# Patient Record
Sex: Female | Born: 1940
Health system: Southern US, Community
[De-identification: ages and names within clinical notes are randomized; demographics above are authoritative.]

## PROBLEM LIST (undated history)

## (undated) DIAGNOSIS — E119 Type 2 diabetes mellitus without complications: Secondary | ICD-10-CM

## (undated) DIAGNOSIS — E785 Hyperlipidemia, unspecified: Secondary | ICD-10-CM

## (undated) DIAGNOSIS — I10 Essential (primary) hypertension: Secondary | ICD-10-CM

## (undated) DIAGNOSIS — J45909 Unspecified asthma, uncomplicated: Secondary | ICD-10-CM

## (undated) HISTORY — DX: Type 2 diabetes mellitus without complications: E11.9

## (undated) HISTORY — PX: OVARIAN CYST REMOVAL: SHX89

## (undated) HISTORY — PX: ABDOMINAL HYSTERECTOMY: SHX81

## (undated) HISTORY — DX: Hyperlipidemia, unspecified: E78.5

## (undated) HISTORY — DX: Essential (primary) hypertension: I10

## (undated) HISTORY — DX: Unspecified asthma, uncomplicated: J45.909

---

## 2005-08-01 ENCOUNTER — Ambulatory Visit: Payer: Self-pay | Admitting: *Deleted

## 2005-08-07 ENCOUNTER — Ambulatory Visit: Payer: Self-pay | Admitting: *Deleted

## 2005-08-07 ENCOUNTER — Encounter: Payer: Self-pay | Admitting: Cardiology

## 2005-08-07 ENCOUNTER — Encounter (HOSPITAL_COMMUNITY): Admission: RE | Admit: 2005-08-07 | Discharge: 2005-09-06 | Payer: Self-pay | Admitting: *Deleted

## 2005-08-21 ENCOUNTER — Ambulatory Visit: Payer: Self-pay | Admitting: *Deleted

## 2005-10-20 ENCOUNTER — Inpatient Hospital Stay (HOSPITAL_COMMUNITY): Admission: EM | Admit: 2005-10-20 | Discharge: 2005-10-22 | Payer: Self-pay | Admitting: Emergency Medicine

## 2005-10-21 ENCOUNTER — Ambulatory Visit: Payer: Self-pay | Admitting: Cardiology

## 2007-03-11 IMAGING — CR DG CHEST 2V
2 series · 2 of 2 positions shown · non-contrast
Comparison: none

CLINICAL DATA: Chest pain for three weeks.  Some shortness of breath.  
 NOOHV-E VIEWS:
 PA and lateral views of the chest are made without previous films for comparison show the heart, lungs, bony thorax and soft tissues to be within the normal limits.  There is minimal anterior degenerative hypertrophic spurring of the lower thoracic spine.

[view not recorded (1 of 2)]
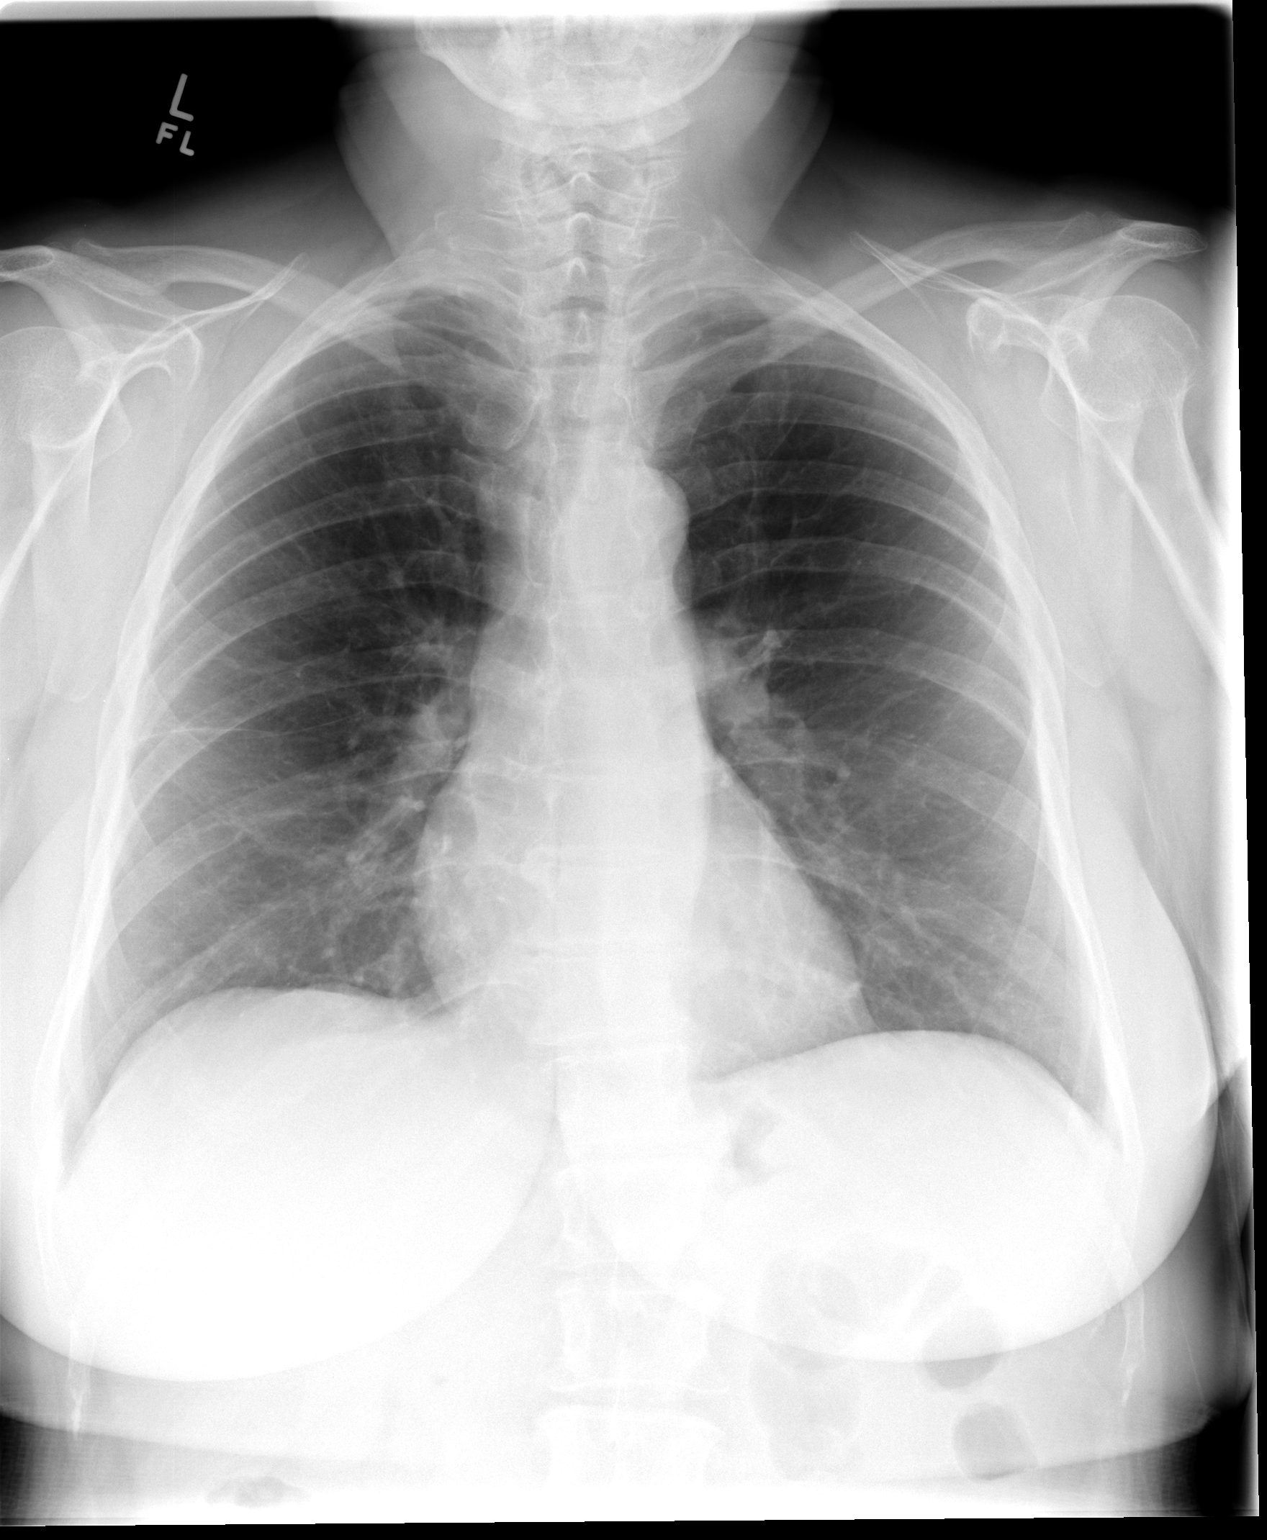

[view not recorded (2 of 2)]
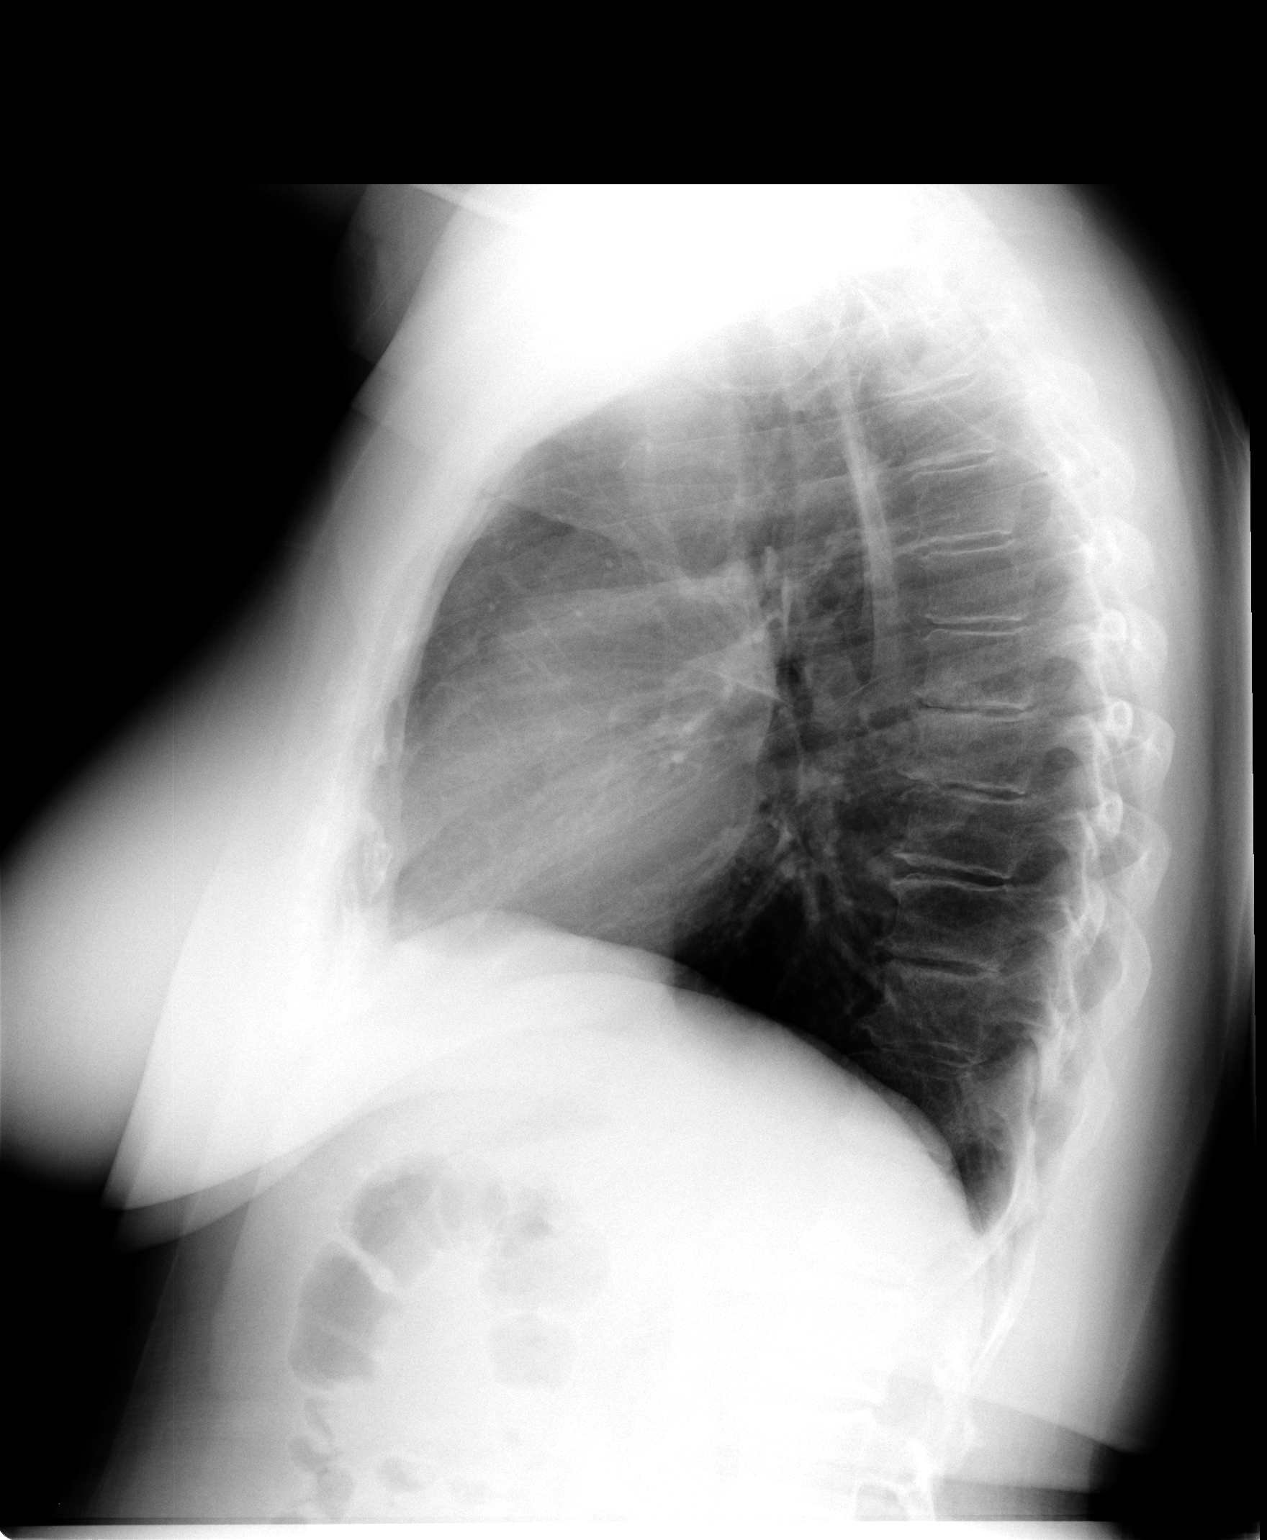

[2 of 2 positions shown; findings below may reference images not displayed]

IMPRESSION: No evidence of active disease in the chest.

## 2007-10-22 ENCOUNTER — Encounter (INDEPENDENT_AMBULATORY_CARE_PROVIDER_SITE_OTHER): Payer: Self-pay | Admitting: Internal Medicine

## 2007-10-22 ENCOUNTER — Ambulatory Visit: Payer: Self-pay | Admitting: Cardiology

## 2007-10-22 LAB — CONVERTED CEMR LAB
AST: 15 units/L
Albumin: 4 g/dL
BUN: 13 mg/dL
Basophils Absolute: 0.1 10*3/uL
CO2: 20 meq/L
Calcium: 9.1 mg/dL
Creatinine, Ser: 0.98 mg/dL
Glucose, Bld: 167 mg/dL
HCT: 38.2 %
Hemoglobin: 12.4 g/dL
Hgb A1c MFr Bld: 6.8 %
LDL Cholesterol: 97 mg/dL
Lymphs Abs: 2.6 10*3/uL
MCHC: 32.5 g/dL
Monocytes Relative: 8 %
RDW: 12.6 %
Total Bilirubin: 0.3 mg/dL
Total CHOL/HDL Ratio: 5.1
Total Protein: 7.3 g/dL
Triglycerides: 166 mg/dL
VLDL: 33 mg/dL

## 2007-10-27 ENCOUNTER — Encounter (INDEPENDENT_AMBULATORY_CARE_PROVIDER_SITE_OTHER): Payer: Self-pay | Admitting: Internal Medicine

## 2007-10-28 ENCOUNTER — Ambulatory Visit: Payer: Self-pay | Admitting: Internal Medicine

## 2007-10-28 DIAGNOSIS — I10 Essential (primary) hypertension: Secondary | ICD-10-CM | POA: Insufficient documentation

## 2007-10-28 DIAGNOSIS — M810 Age-related osteoporosis without current pathological fracture: Secondary | ICD-10-CM | POA: Insufficient documentation

## 2007-11-03 ENCOUNTER — Ambulatory Visit (HOSPITAL_COMMUNITY): Admission: RE | Admit: 2007-11-03 | Discharge: 2007-11-03 | Payer: Self-pay | Admitting: Internal Medicine

## 2007-11-03 ENCOUNTER — Encounter (INDEPENDENT_AMBULATORY_CARE_PROVIDER_SITE_OTHER): Payer: Self-pay | Admitting: Internal Medicine

## 2007-11-04 HISTORY — PX: COLONOSCOPY W/ POLYPECTOMY: SHX1380

## 2007-11-05 ENCOUNTER — Encounter (INDEPENDENT_AMBULATORY_CARE_PROVIDER_SITE_OTHER): Payer: Self-pay | Admitting: Internal Medicine

## 2007-11-19 ENCOUNTER — Encounter (INDEPENDENT_AMBULATORY_CARE_PROVIDER_SITE_OTHER): Payer: Self-pay | Admitting: Internal Medicine

## 2007-11-24 ENCOUNTER — Ambulatory Visit: Payer: Self-pay | Admitting: Gastroenterology

## 2007-11-24 ENCOUNTER — Ambulatory Visit (HOSPITAL_COMMUNITY): Admission: RE | Admit: 2007-11-24 | Discharge: 2007-11-24 | Payer: Self-pay | Admitting: Gastroenterology

## 2007-11-24 ENCOUNTER — Encounter (INDEPENDENT_AMBULATORY_CARE_PROVIDER_SITE_OTHER): Payer: Self-pay | Admitting: Internal Medicine

## 2007-11-24 ENCOUNTER — Encounter: Payer: Self-pay | Admitting: Gastroenterology

## 2007-12-22 ENCOUNTER — Ambulatory Visit: Payer: Self-pay | Admitting: Internal Medicine

## 2007-12-22 DIAGNOSIS — D72829 Elevated white blood cell count, unspecified: Secondary | ICD-10-CM | POA: Insufficient documentation

## 2007-12-22 DIAGNOSIS — R7309 Other abnormal glucose: Secondary | ICD-10-CM | POA: Insufficient documentation

## 2007-12-23 LAB — CONVERTED CEMR LAB
BUN: 15 mg/dL (ref 6–23)
Basophils Relative: 1 % (ref 0–1)
Calcium: 9.5 mg/dL (ref 8.4–10.5)
Eosinophils Absolute: 0.2 10*3/uL (ref 0.0–0.7)
Eosinophils Relative: 2 % (ref 0–5)
Glucose, Bld: 118 mg/dL — ABNORMAL HIGH (ref 70–99)
HCT: 37.4 % (ref 36.0–46.0)
Hemoglobin: 12.5 g/dL (ref 12.0–15.0)
Lymphocytes Relative: 25 % (ref 12–46)
Lymphs Abs: 2.1 10*3/uL (ref 0.7–4.0)
MCHC: 33.4 g/dL (ref 30.0–36.0)
Monocytes Relative: 7 % (ref 3–12)
Neutrophils Relative %: 65 % (ref 43–77)

## 2008-03-23 ENCOUNTER — Ambulatory Visit: Payer: Self-pay | Admitting: Internal Medicine

## 2008-03-23 LAB — CONVERTED CEMR LAB: Blood Glucose, Fingerstick: 112

## 2008-10-11 ENCOUNTER — Telehealth (INDEPENDENT_AMBULATORY_CARE_PROVIDER_SITE_OTHER): Payer: Self-pay | Admitting: *Deleted

## 2008-10-12 ENCOUNTER — Ambulatory Visit: Payer: Self-pay | Admitting: Internal Medicine

## 2008-10-12 DIAGNOSIS — R12 Heartburn: Secondary | ICD-10-CM | POA: Insufficient documentation

## 2008-10-12 DIAGNOSIS — M542 Cervicalgia: Secondary | ICD-10-CM | POA: Insufficient documentation

## 2008-10-20 DIAGNOSIS — E785 Hyperlipidemia, unspecified: Secondary | ICD-10-CM | POA: Insufficient documentation

## 2010-05-28 ENCOUNTER — Encounter: Payer: Self-pay | Admitting: Internal Medicine

## 2010-09-18 NOTE — Assessment & Plan Note (Signed)
[facility]                       [facility] OFFICE NOTE   NAME:Knightly, [name]                          MRN:          [id number]  DATE:10/22/2007                            DOB:          Aug 11, 1940    REFERRING:  None.   [name] returns to the office at the request of her daughter.  She was  previously followed by Dr. Dorethea Clan for hypertension and dyslipidemia.  She had had a history of chest discomfort and an abnormal stress EKG,  but nuclear imaging of her myocardium was normal.  She has continued to  do well, but discontinued all of her medicines when the prescription was  exhausted.  She now returns for reevaluation and renewal of her  prescriptions as appropriate.   [name] was hospitalized approximately 2 years ago for a polyarthritis  and diffuse skin rash.  No specific diagnosis was established.  All of  her symptoms resolved.  She was recently seen in an Urgent Care in  [place] for arthritic discomfort, felt due to gout.  She has taken a  course of Naprosyn with improvement.  Her only other routine medication  is a calcium supplement.  She was previously treated with Toprol,  aspirin, simvastatin, and lisinopril.   Review of systems, past medical, social, and family history were updated  with appropriate notations made to the patient's office chart.   PHYSICAL EXAMINATION:  GENERAL:  Pleasant, diminutive woman, in no acute  distress.  VITAL SIGNS:  The weight is 125, 12 pounds less than 2 year ago.  Blood  pressure 145/80 on the right and 150/80 on the left.  Heart rate 75 and  regular and respirations 14.  HEENT:  Nonicteric sclerae; EOMs full; normal lids and conjunctivae.  NECK:  No jugular venous distention; no carotid bruits.  ENDOCRINE:  No thyromegaly.  HEMATOPOIETIC:  No adenopathy.  LUNGS:  Clear.  CARDIAC:  Normal first and second heart sounds; modest sytolic ejection  murmur; fourth heart sound present.  ABDOMEN:   Soft and nontender; no organomegaly.  EXTREMITIES:  No edema; 1+ distal pulses; pulses are monophasic and  biphasic by Doppler.   Health maintenance issues were reviewed.  The patient has never had a  mammogram.  She has never undergone colonoscopy.  She has not had a bone  densitometry study.  She did receive pneumococcal vaccine approximately  2 years ago, but does not receive influenza vaccine routinely.   IMPRESSION:  Overall, [name] is doing quite well.  She does appear to  have mild hypertension.  This may be somewhat exacerbated by treatment  with a nonsteroidal antiinflammatory agent, but she would benefit from  some therapy.  Lisinopril will be started at a dose of 20 mg daily with  subsequent blood pressure checks and a chemistry profile in one month.   She will require substantial health maintenance attention.  We will  refer her to primary care doctor for this.   Baseline laboratories including a chemistry profile, CBC, stool for  Hemoccult testing, and a lipid profile will be obtained.  Dyslipidemia  will  be treated, if necessary, according to guidelines.  She also has a  questionable history of diabetes in the past.  An A1c level will be  obtained.  I will not plan to see this nice lady routinely.     Gerrit Friends. Dietrich Pates, MD, [facility]  Electronically Signed    RMR/MedQ  DD: 10/22/2007  DT: 10/23/2007  Job #: [id number]

## 2010-09-18 NOTE — Op Note (Signed)
[name], [name]                   ACCOUNT NO.:  [id number]   MEDICAL RECORD NO.:  [id number]          PATIENT TYPE:  AMB   LOCATION:  DAY                           FACILITY:  [facility]   PHYSICIAN:  Kassie Mends, M.D.      DATE OF BIRTH:  [birth year]   DATE OF PROCEDURE:  11/24/2007  DATE OF DISCHARGE:                               OPERATIVE REPORT   REFERRING PHYSICIAN:  Erle Crocker, MD   PROCEDURE:  Colonoscopy with cold forceps and snare cautery polypectomy.   INDICATIONS FOR PROCEDURE:  [name] is a 70 year old female who  presents with a family history of colon cancer.   FINDINGS:  1. Multiple ascending colon polyps.  The polyps were between 3 mm and      6 mm.  The polyps were removed via cold forceps and snare cautery.      Single transverse colon polyp approximately 3 mm in size was      removed via cold forceps.  Single descending colon polyp      approximately 6 mm in size were removed via cold forceps.  2. Frequent sigmoid colon diverticula.  Otherwise, no masses,      inflammatory changes, or AVMs seen.  3. Normal retroflexed view of the rectum.   DIAGNOSIS:  Multiple colon polyps.   RECOMMENDATIONS:  1. Screening colonoscopy in 5 years due to family history of colon      cancer and multiple polyps found on this endoscopy .  2. No aspirin, NSAIDs, or anticoagulation for 7 days.  3. Will call [name] with the results of her biopsies.  4. She should follow a high-fiber diet.  She was given a handout on      high-fiber diet, polyps, and diverticulosis.   MEDICATIONS:  1. Demerol 50 mg IV.  2. Versed 5 mg IV.   PROCEDURE TECHNIQUE:  Physical exam was performed, informed consent was  obtained from the patient after explaining the benefits, risks, and  alternatives to the procedure.  The patient was connected to the monitor  and placed in left lateral position.  Continuous oxygen was provided via  nasal cannula and IV medicines were administered through an  indwelling  cannula.  After administration, sedation, and rectal exam, the patient's  rectum was intubated and the scope was advanced under direct  visualization through the cecum.  The scope was removed  slowly by carefully examining the color, texture, anatomy, and integrity  of the mucosa on the way out.  The patient was recovered in endoscopy  and discharged home in satisfactory condition.   PATH:  Simple adenomas. TCS 5 years. High fiber diet.      Kassie Mends, M.D.  Electronically Signed     SM/MEDQ  D:  11/24/2007  T:  11/25/2007  Job:  [id number]   cc:   Erle Crocker, M.D.

## 2010-09-21 NOTE — Discharge Summary (Signed)
[name], [name]                   ACCOUNT NO.:  [id number]   MEDICAL RECORD NO.:  [id number]          PATIENT TYPE:  INP   LOCATION:  A313                          FACILITY:  [facility]   PHYSICIAN:  Osvaldo Shipper, MD     DATE OF BIRTH:  03-Jun-1940   DATE OF ADMISSION:  10/20/2005  DATE OF DISCHARGE:  06/19/2007LH                                 DISCHARGE SUMMARY   PRIMARY CARE PHYSICIAN:  The patient does not have a PMD.  She is going to  get an appointment with Dr. Katrinka Blazing for follow-up.  She sees a cardiologist,  Dr. Vida Roller.   DISCHARGE DIAGNOSES:  1.  Polyarthritis of unclear etiology.  2.  Likely type 2 diabetes requiring outpatient follow-up.  3.  Hypertension and dyslipidemia, stable.  4.  Abnormal stress test.  Follow up with cardiology.  5.  Acute bronchitis, improved.   HISTORY OF PRESENT ILLNESS:  Please review H & P dictated at the time of  admission for details regarding patient's presenting illness.   BRIEF HOSPITAL COURSE:  1.  Polyarthritis.  The patient is a 70 year old Caucasian female who has a      past medical history of dyslipidemia, hypertension, and an abnormal      stress test for which she is following with cardiology, who presented to      the ED with fevers, chills, and joint pains.  Most of her joint      involvement was basically her bilaterally wrists as well as bilateral      ankles.  She had sparing of her elbow and knee joints.  The etiology for      her acute polyarthritis was not very clear.  The patient was noted to be      recovering from an acute upper respiratory tract infection and it was      thought that this was likely secondary to a viral syndrome.  This could      also be secondary to gout, although her uric acid was 7.4 which is just      borderline high.  Her ANA and rheumatoid factor were also unremarkable.      X-rays of the wrist joint showed soft tissue swelling with no evidence      for inflammatory arthropathy.  Blood  cultures were negative.  An      echocardiogram was also done which was unremarkable.  There was also a      question of a tick bite for which doxycycline was initiated.  The      patient was also started on steroids which will be continued as well for      now.  I think the patient may have seronegative arthritis.  We will try      to make an appointment for the patient with a rheumatologist.  We do not      have one in this community.  The closest would be at [facility].  2.  Elevated blood sugars.  These were noted at the time of admission.  HBA1C  was checked which was 6.5.  The patient likely has type 2      diabetes.  I have asked her to follow up with a PMD to further monitor      and initiate treatment for this.  3.  Her other medical issues include hypertension, dyslipidemia, and an      abnormal stress test about 2-3 months ago.  She may follow up with her      cardiologist for further management of the above.  She remained chest      pain free during this admission.   On the day of discharge, she had been afebrile for more than 24 hours.  She  is saturating well on room air.  She feels quite well and has less  discomfort in her joints.  She has been ambulating with no difficulties.  Based on the above, she is considered stable for discharge.   DISCHARGE MEDICATIONS:  1.  Ibuprofen 400 mg three times a day with meals for three days and then as      needed.  2.  Doxycycline 100 mg twice a day for nine days.  3.  Prednisone 40 mg for three days, then 20 mg for four days, then 10 mg      for four days, then 5 mg for four days, and then stop.  4.  She has been asked to continue her other home medications as before      which include Toprol XL, Simvastatin, lisinopril, and aspirin.   FOLLOW UP:  1.  Follow up with Dr. Stacey Drain, appointment will be made in 1-2      weeks if possible.  2.  With PMD, patient is choosing Dr. Katrinka Blazing.  She was encouraged to make an       earlier appointment especially considering the possibility of type 2      diabetes.   OTHER INSTRUCTIONS:  The patient has been told that steroids may increase  her blood sugars.  She has been asked to make a quick appointment with Dr.  Katrinka Blazing.  She has been asked to check her blood sugars at the health  department if she experiences any dizziness, any vision difficulties,  nausea, or vomiting.  She has been asked to return to the ED if she has high  fevers not relieved with Tylenol or if she has any other symptoms which are  not easily relieved.   CONSULTATIONS:  No consultations obtained during this hospital stay.   DIET:  She should have a heart-healthy diet.   PHYSICAL ACTIVITY:  No restrictions.   IMAGING AND OTHER STUDIES:  X-ray of the chest which did not show any acute  or active disease process.  She also had x-ray of her wrist joint which just  showed soft tissue swelling without evidence for inflammatory arthropathy.  There were some osteoarthritic changes of the first carpal joint of the  right hand.  She also had an echocardiogram which was also unremarkable.      Osvaldo Shipper, MD  Electronically Signed     GK/MEDQ  D:  10/22/2005  T:  10/22/2005  Job:  [id number]   cc:   Dirk Dress. Katrinka Blazing, M.D.  Fax: [phone]   Aundra Dubin, M.D.  [address]  [place]  [place] [address]   Vida Roller, M.D.  Fax: [phone]

## 2010-09-21 NOTE — H&P (Signed)
NAMECHRISTENA, [name]                   ACCOUNT NO.:  [id number]   MEDICAL RECORD NO.:  [id number]          PATIENT TYPE:  INP   LOCATION:  A313                          FACILITY:  [facility]   PHYSICIAN:  Margaretmary Dys, M.D.DATE OF BIRTH:  1940-09-30   DATE OF ADMISSION:  10/20/2005  DATE OF DISCHARGE:  LH                                HISTORY & PHYSICAL   ADMITTING DIAGNOSES:  1.  Polyarthritis of acute onset.  2.  Generalized myalgia.   PRIMARY CARE PHYSICIAN:  The patient is unassigned.   CHIEF COMPLAINT:  Generalized pain and multiple joint aches.   HISTORY OF PRESENT ILLNESS:  [name] is a 70 year old Caucasian female who  denies any significant past medical history.  She presents to the emergency  room with a 2-day history of acute joint pain involving her wrist and also  her ankles.  The patient has been unable to ambulate because of the pain.  She describes the pain as a 7-8/10.  There is no associated swelling or  warmth of those joints.  She denies any fevers, but daughters felt that she  may been warm to touch.  She denies any nausea or vomiting, no abdominal  pain.   She was seen in the emergency room and felt concern for probable underlying  cause for her acute polyarthritis.  The patient has been admitted now for  further management.  She denies any prior history of degenerative joint  disease.   REVIEW OF SYSTEMS:  Ten-point review of systems otherwise negative, except  as mentioned in history of present illness.   PAST MEDICAL HISTORY:  1.  Hypertension.  2.  Dyslipidemia.   PAST SURGICAL HISTORY:  Hysterectomy.   MEDICATIONS:  1.  Toprol-XL 30 mg p.o. once a day, which appears to be a strange dose.  2.  Simvastatin 40 mg at bedtime.  3.  Lisinopril 5 mg p.o. once a day.  4.  Aspirin half a tablet once a day.   ALLERGIES:  She has no known drug allergies.   SOCIAL HISTORY:  The patient is single, lives at home by herself, does not  smoke, does not drink  alcohol, denies any illicit drug use, denies any  recent hunting trip.  [place]  She denies any  recent tick bite.   FAMILY HISTORY:  Positive for hypertension and diabetes, but otherwise  noncontributory.   PHYSICAL EXAMINATION:  GENERAL:  Conscious, alert, comfortable, not in acute  distress.  VITAL SIGNS:  Blood pressure 112/63, pulse of 83, respirations 28,  temperature 99.1 degrees Fahrenheit.  HEENT:  Normocephalic, atraumatic.  Oral mucosa was dry.  No exudates were  noted.  NECK:  Supple with no JVD and no lymphadenopathy.  LUNGS:  Clear clinically with good air entry bilaterally.  HEART:  S1 and S2 regular.  No S3, S4, gallops or rubs.  ABDOMEN:  Soft, not tender.  Bowel sounds were positive.  No masses  palpable.  EXTREMITIES:  The patient had multiple swelling and tenderness in both wrist  joints and ankle  joints.  Her elbows and her knees were spared.  I did not  feel any fluctuance.  CNS:  Exam was grossly intact with no focal deficits.   LABORATORY/DIAGNOSTIC DATA:  White blood cell count was 16.6, hemoglobin of  12.7, hematocrit 37.3 and platelet count was 449,000.  ESR was 25.  Sodium  was 130, potassium 4.0, chloride of 101, CO2 was 22, glucose of 183, BUN of  14, creatinine was 1.2, AST 22, ALT of 18, calcium was 8.9.  TSH was 1.498.  Cortisol level was 34.6.  Blood cultures are pending.   ASSESSMENT AND PLAN:  [age]-year-old Caucasian female presents with  acute polyarthritis.  Etiology is unknown at this time.  It could represent  tick bite.  The patient does have a slight leukocytosis again which may be  nonspecific.  It could be an acute viral illness; the patient says she has  just recuperated from an upper respiratory tract infection in the past  couple of weeks.  Also a possibility is acute gout.   Plan is to admit her at this time for pain control.  We will hydrate her  with intravenous fluids.  I will check a uric acid  level and rheumatoid  factor to rule out other autoimmune disease.  I did notice that her ESR is  not remarkably high though.  We will also send for [facility] spotted  fever titers.  I do not see any indication for any antibiotics at this time.  I will follow her here in the hospital.  If she spikes a fever and  leukocytosis persists, then she may benefit from starting empirically on  doxycycline for treatment of rickettsial disease.   She will be resumed on all of her home medications at this time.  We will  mobilize her as soon as she is able to.   I think further data are needed to characterize the underlying reason for  her polyarthritis.      Margaretmary Dys, M.D.  Electronically Signed     AM/MEDQ  D:  10/21/2005  T:  10/21/2005  Job:  [id number]

## 2010-09-21 NOTE — Procedures (Signed)
[name], [name]                   ACCOUNT NO.:  [id number]   MEDICAL RECORD NO.:  [id number]          PATIENT TYPE:  INP   LOCATION:  A313                          FACILITY:  [facility]   PHYSICIAN:  Paris Bing, M.D. [facility] OF BIRTH:  [birth year]   DATE OF PROCEDURE:  10/21/2005  DATE OF DISCHARGE:                                  ECHOCARDIOGRAM   REFERRING:  [place]ai[place]ese.   CLINICAL DATA:  A 70 year old woman with a polyarthritis, fever and  hypertension.   Aorta 2.7, left atrium 3.5, septum 1.1, posterior wall 1.0, LV diastole 4.2,  LV systole 2.7.   IMPRESSION:  1.  Technically adequate echocardiographic study.  2.  Normal left atrium, right atrium and right ventricle.  3.  Normal aortic, mitral, tricuspid and pulmonic valves.  4.  Normal Doppler study with physiologic tricuspid regurgitation.  5.  Normal internal dimension, wall thickness, regional and global function      of the left ventricle.  6.  Normal IVC.      Culebra Bing, M.D. [facility]  Electronically Signed     RR/MEDQ  D:  10/21/2005  T:  10/22/2005  Job:  [id number]

## 2010-09-21 NOTE — Procedures (Signed)
NAMERAINBOW, SALMAN                   ACCOUNT NO.:  [id number]   MEDICAL RECORD NO.:  [id number]           PATIENT TYPE:   LOCATION:                                 FACILITY:   PHYSICIAN:  Vida Roller, M.D.   DATE OF BIRTH:  1940/08/13   DATE OF PROCEDURE:  DATE OF DISCHARGE:                                    STRESS TEST   HISTORY:  Ms. Trapani is a 70 year old female with no known coronary disease  with atypical chest discomfort.  Her cardiac risk factors include  hypertension and hyperlipidemia.   BASELINE DATA:  Sinus rhythm at __________ beats per minute with nonspecific  ST abnormalities.  Blood pressure is 136/72.   Patient exercised for a total of 4 minutes and 34 seconds Bruce protocol  stage II and 7 METS.  Maximum heart rate achieved was 144 beats per minute  which is 93% of predicted max.  Maximum blood pressure is 148/62 and  resolved down to 122/58 in recovery.  EKG revealed no significant  arrhythmia.  She did have baseline abnormal electrocardiogram with increase  in the ST depression in inferolateral leads with exercise.   Final images and results are pending M.D. review.      Jae Dire, P.A. [facility]      Vida Roller, M.D.  Electronically Signed    AB/MEDQ  D:  08/07/2005  T:  08/08/2005  Job:  [id number]

## 2012-06-05 NOTE — Patient Instructions (Signed)
[name]  06/05/2012   Your procedure is scheduled on:  06/11/12  Report to [name] at 1240 PM.  Call this number if you have problems the morning of surgery: [phone]   Remember:   Do not eat food or drink liquids after midnight.   Take these medicines the morning of surgery with A SIP OF WATER:    Do not wear jewelry, make-up or nail polish.  Do not wear lotions, powders, or perfumes. You may wear deodorant.  Do not shave 48 hours prior to surgery. Men may shave face and neck.  Do not bring valuables to the hospital.  Contacts, dentures or bridgework may not be worn into surgery.  Leave suitcase in the car. After surgery it may be brought to your room.  For patients admitted to the hospital, checkout time is 11:00 AM the day of  discharge.   Patients discharged the day of surgery will not be allowed to drive  home.  Name and phone number of your driver: family  Special Instructions: N/A   Please read over the following fact sheets that you were given: Anesthesia Post-op Instructions and Care and Recovery After Surgery   PATIENT INSTRUCTIONS POST-ANESTHESIA  IMMEDIATELY FOLLOWING SURGERY:  Do not drive or operate machinery for the first twenty four hours after surgery.  Do not make any important decisions for twenty four hours after surgery or while taking narcotic pain medications or sedatives.  If you develop intractable nausea and vomiting or a severe headache please notify your doctor immediately.  FOLLOW-UP:  Please make an appointment with your surgeon as instructed. You do not need to follow up with anesthesia unless specifically instructed to do so.  WOUND CARE INSTRUCTIONS (if applicable):  Keep a dry clean dressing on the anesthesia/puncture wound site if there is drainage.  Once the wound has quit draining you may leave it open to air.  Generally you should leave the bandage intact for twenty four hours unless there is drainage.  If the epidural site drains for more  than 36-48 hours please call the anesthesia department.  QUESTIONS?:  Please feel free to call your physician or the hospital operator if you have any questions, and they will be happy to assist you.      Cataract Surgery  A cataract is a clouding of the lens of the eye. When a lens becomes cloudy, vision is reduced based on the degree and nature of the clouding. Surgery may be needed to improve vision. Surgery removes the cloudy lens and usually replaces it with a substitute lens (intraocular lens, IOL). LET YOUR EYE DOCTOR KNOW ABOUT:  Allergies to food or medicine.  Medicines taken including herbs, eyedrops, over-the-counter medicines, and creams.  Use of steroids (by mouth or creams).  Previous problems with anesthetics or numbing medicine.  History of bleeding problems or blood clots.  Previous surgery.  Other health problems, including diabetes and kidney problems.  Possibility of pregnancy, if this applies. RISKS AND COMPLICATIONS  Infection.  Inflammation of the eyeball (endophthalmitis) that can spread to both eyes (sympathetic ophthalmia).  Poor wound healing.  If an IOL is inserted, it can later fall out of proper position. This is very uncommon.  Clouding of the part of your eye that holds an IOL in place. This is called an "after-cataract." These are uncommon, but easily treated. BEFORE THE PROCEDURE  Do not eat or drink anything except small amounts of water for 8 to 12 before your surgery, or  as directed by your caregiver.  Unless you are told otherwise, continue any eyedrops you have been prescribed.  Talk to your primary caregiver about all other medicines that you take (both prescription and non-prescription). In some cases, you may need to stop or change medicines near the time of your surgery. This is most important if you are taking blood-thinning medicine.Do not stop medicines unless you are told to do so.  Arrange for someone to drive you to and  from the procedure.  Do not put contact lenses in either eye on the day of your surgery. PROCEDURE There is more than one method for safely removing a cataract. Your doctor can explain the differences and help determine which is best for you. Phacoemulsification surgery is the most common form of cataract surgery.  An injection is given behind the eye or eyedrops are given to make this a painless procedure.  A small cut (incision) is made on the edge of the clear, dome-shaped surface that covers the front of the eye (cornea).  A tiny probe is painlessly inserted into the eye. This device gives off ultrasound waves that soften and break up the cloudy center of the lens. This makes it easier for the cloudy lens to be removed by suction.  An IOL may be implanted.  The normal lens of the eye is covered by a clear capsule. Part of that capsule is intentionally left in the eye to support the IOL.  Your surgeon may or may not use stitches to close the incision. There are other forms of cataract surgery that require a larger incision and stiches to close the eye. This approach is taken in cases where the doctor feels that the cataract cannot be easily removed using phacoemulsification. AFTER THE PROCEDURE  When an IOL is implanted, it does not need care. It becomes a permanent part of your eye and cannot be seen or felt.  Your doctor will schedule follow-up exams to check on your progress.  Review your other medicines with your doctor to see which can be resumed after surgery.  Use eyedrops or take medicine as prescribed by your doctor. Document Released: 04/11/2011 Document Revised: 07/15/2011 Document Reviewed: 04/11/2011 [facility] Patient Information 2013 [place], [place].

## 2012-06-08 ENCOUNTER — Encounter (HOSPITAL_COMMUNITY)
Admission: RE | Admit: 2012-06-08 | Discharge: 2012-06-08 | Disposition: A | Discharge status: Home / Self Care | Payer: PRIVATE HEALTH INSURANCE | Source: Ambulatory Visit | Attending: Ophthalmology | Admitting: Ophthalmology

## 2012-06-11 ENCOUNTER — Encounter (HOSPITAL_COMMUNITY): Admission: RE | Payer: Self-pay | Source: Ambulatory Visit

## 2012-06-11 ENCOUNTER — Ambulatory Visit (HOSPITAL_COMMUNITY)
Admission: RE | Admit: 2012-06-11 | Payer: PRIVATE HEALTH INSURANCE | Source: Ambulatory Visit | Admitting: Ophthalmology

## 2012-06-11 SURGERY — PHACOEMULSIFICATION, CATARACT, WITH IOL INSERTION
Anesthesia: Monitor Anesthesia Care | Site: Eye | Laterality: Left

## 2016-02-15 ENCOUNTER — Encounter (INDEPENDENT_AMBULATORY_CARE_PROVIDER_SITE_OTHER): Payer: Medicare Other | Admitting: Ophthalmology

## 2016-02-15 DIAGNOSIS — H353211 Exudative age-related macular degeneration, right eye, with active choroidal neovascularization: Secondary | ICD-10-CM | POA: Diagnosis not present

## 2016-02-15 DIAGNOSIS — H43811 Vitreous degeneration, right eye: Secondary | ICD-10-CM

## 2016-02-15 DIAGNOSIS — H2513 Age-related nuclear cataract, bilateral: Secondary | ICD-10-CM

## 2016-03-07 ENCOUNTER — Encounter (INDEPENDENT_AMBULATORY_CARE_PROVIDER_SITE_OTHER): Payer: Medicare Other | Admitting: Ophthalmology

## 2016-03-07 DIAGNOSIS — H2513 Age-related nuclear cataract, bilateral: Secondary | ICD-10-CM

## 2016-03-07 DIAGNOSIS — H353211 Exudative age-related macular degeneration, right eye, with active choroidal neovascularization: Secondary | ICD-10-CM

## 2016-03-07 DIAGNOSIS — H43811 Vitreous degeneration, right eye: Secondary | ICD-10-CM

## 2016-04-04 ENCOUNTER — Encounter (INDEPENDENT_AMBULATORY_CARE_PROVIDER_SITE_OTHER): Payer: Medicare Other | Admitting: Ophthalmology

## 2016-04-04 DIAGNOSIS — H353122 Nonexudative age-related macular degeneration, left eye, intermediate dry stage: Secondary | ICD-10-CM

## 2016-04-04 DIAGNOSIS — H2511 Age-related nuclear cataract, right eye: Secondary | ICD-10-CM | POA: Diagnosis not present

## 2016-04-04 DIAGNOSIS — H353211 Exudative age-related macular degeneration, right eye, with active choroidal neovascularization: Secondary | ICD-10-CM | POA: Diagnosis not present

## 2016-04-04 DIAGNOSIS — H43813 Vitreous degeneration, bilateral: Secondary | ICD-10-CM | POA: Diagnosis not present

## 2016-05-09 ENCOUNTER — Encounter (INDEPENDENT_AMBULATORY_CARE_PROVIDER_SITE_OTHER): Payer: Medicare Other | Admitting: Ophthalmology

## 2016-05-23 ENCOUNTER — Encounter (INDEPENDENT_AMBULATORY_CARE_PROVIDER_SITE_OTHER): Payer: Medicare Other | Admitting: Ophthalmology

## 2016-06-20 ENCOUNTER — Encounter (INDEPENDENT_AMBULATORY_CARE_PROVIDER_SITE_OTHER): Payer: Medicare Other | Admitting: Ophthalmology

## 2016-07-10 ENCOUNTER — Encounter (INDEPENDENT_AMBULATORY_CARE_PROVIDER_SITE_OTHER): Payer: Medicare Other | Admitting: Ophthalmology

## 2017-05-19 DIAGNOSIS — J45909 Unspecified asthma, uncomplicated: Secondary | ICD-10-CM | POA: Diagnosis not present

## 2017-06-19 DIAGNOSIS — J45909 Unspecified asthma, uncomplicated: Secondary | ICD-10-CM | POA: Diagnosis not present

## 2017-06-23 DIAGNOSIS — R69 Illness, unspecified: Secondary | ICD-10-CM | POA: Diagnosis not present

## 2017-06-24 DIAGNOSIS — R69 Illness, unspecified: Secondary | ICD-10-CM | POA: Diagnosis not present

## 2017-07-17 DIAGNOSIS — J45909 Unspecified asthma, uncomplicated: Secondary | ICD-10-CM | POA: Diagnosis not present

## 2017-08-12 DIAGNOSIS — R69 Illness, unspecified: Secondary | ICD-10-CM | POA: Diagnosis not present

## 2017-08-17 DIAGNOSIS — J45909 Unspecified asthma, uncomplicated: Secondary | ICD-10-CM | POA: Diagnosis not present

## 2017-09-16 DIAGNOSIS — J45909 Unspecified asthma, uncomplicated: Secondary | ICD-10-CM | POA: Diagnosis not present

## 2017-10-17 DIAGNOSIS — J45909 Unspecified asthma, uncomplicated: Secondary | ICD-10-CM | POA: Diagnosis not present

## 2017-11-11 DIAGNOSIS — R69 Illness, unspecified: Secondary | ICD-10-CM | POA: Diagnosis not present

## 2017-11-16 DIAGNOSIS — J45909 Unspecified asthma, uncomplicated: Secondary | ICD-10-CM | POA: Diagnosis not present

## 2017-12-02 DIAGNOSIS — M109 Gout, unspecified: Secondary | ICD-10-CM | POA: Diagnosis not present

## 2017-12-02 DIAGNOSIS — R0789 Other chest pain: Secondary | ICD-10-CM | POA: Diagnosis not present

## 2017-12-02 DIAGNOSIS — M25512 Pain in left shoulder: Secondary | ICD-10-CM | POA: Diagnosis not present

## 2017-12-02 DIAGNOSIS — H269 Unspecified cataract: Secondary | ICD-10-CM | POA: Diagnosis not present

## 2017-12-02 DIAGNOSIS — I10 Essential (primary) hypertension: Secondary | ICD-10-CM | POA: Diagnosis not present

## 2017-12-02 DIAGNOSIS — J45909 Unspecified asthma, uncomplicated: Secondary | ICD-10-CM | POA: Diagnosis not present

## 2017-12-02 DIAGNOSIS — R079 Chest pain, unspecified: Secondary | ICD-10-CM | POA: Diagnosis not present

## 2017-12-02 DIAGNOSIS — Z90710 Acquired absence of both cervix and uterus: Secondary | ICD-10-CM | POA: Diagnosis not present

## 2017-12-02 DIAGNOSIS — Z79899 Other long term (current) drug therapy: Secondary | ICD-10-CM | POA: Diagnosis not present

## 2017-12-02 DIAGNOSIS — Z9181 History of falling: Secondary | ICD-10-CM | POA: Diagnosis not present

## 2017-12-02 DIAGNOSIS — Z6826 Body mass index (BMI) 26.0-26.9, adult: Secondary | ICD-10-CM | POA: Diagnosis not present

## 2017-12-02 DIAGNOSIS — R262 Difficulty in walking, not elsewhere classified: Secondary | ICD-10-CM | POA: Diagnosis not present

## 2017-12-03 DIAGNOSIS — R0789 Other chest pain: Secondary | ICD-10-CM | POA: Diagnosis not present

## 2017-12-09 DIAGNOSIS — R079 Chest pain, unspecified: Secondary | ICD-10-CM | POA: Diagnosis not present

## 2017-12-09 DIAGNOSIS — I10 Essential (primary) hypertension: Secondary | ICD-10-CM | POA: Diagnosis not present

## 2017-12-17 DIAGNOSIS — J45909 Unspecified asthma, uncomplicated: Secondary | ICD-10-CM | POA: Diagnosis not present

## 2018-01-02 DIAGNOSIS — I10 Essential (primary) hypertension: Secondary | ICD-10-CM | POA: Diagnosis not present

## 2018-01-02 DIAGNOSIS — R079 Chest pain, unspecified: Secondary | ICD-10-CM | POA: Diagnosis not present

## 2018-01-02 DIAGNOSIS — R269 Unspecified abnormalities of gait and mobility: Secondary | ICD-10-CM | POA: Diagnosis not present

## 2018-01-02 DIAGNOSIS — Z6826 Body mass index (BMI) 26.0-26.9, adult: Secondary | ICD-10-CM | POA: Diagnosis not present

## 2018-01-09 DIAGNOSIS — I10 Essential (primary) hypertension: Secondary | ICD-10-CM | POA: Diagnosis not present

## 2018-01-09 DIAGNOSIS — E1165 Type 2 diabetes mellitus with hyperglycemia: Secondary | ICD-10-CM | POA: Diagnosis not present

## 2018-01-09 DIAGNOSIS — Z6826 Body mass index (BMI) 26.0-26.9, adult: Secondary | ICD-10-CM | POA: Diagnosis not present

## 2018-01-09 DIAGNOSIS — Z7689 Persons encountering health services in other specified circumstances: Secondary | ICD-10-CM | POA: Diagnosis not present

## 2018-01-09 DIAGNOSIS — J209 Acute bronchitis, unspecified: Secondary | ICD-10-CM | POA: Diagnosis not present

## 2018-01-17 DIAGNOSIS — J45909 Unspecified asthma, uncomplicated: Secondary | ICD-10-CM | POA: Diagnosis not present

## 2018-02-16 DIAGNOSIS — J45909 Unspecified asthma, uncomplicated: Secondary | ICD-10-CM | POA: Diagnosis not present

## 2018-02-20 DIAGNOSIS — R69 Illness, unspecified: Secondary | ICD-10-CM | POA: Diagnosis not present

## 2018-03-19 DIAGNOSIS — J45909 Unspecified asthma, uncomplicated: Secondary | ICD-10-CM | POA: Diagnosis not present

## 2018-04-18 DIAGNOSIS — J45909 Unspecified asthma, uncomplicated: Secondary | ICD-10-CM | POA: Diagnosis not present

## 2018-05-19 DIAGNOSIS — J45909 Unspecified asthma, uncomplicated: Secondary | ICD-10-CM | POA: Diagnosis not present

## 2018-06-19 DIAGNOSIS — J45909 Unspecified asthma, uncomplicated: Secondary | ICD-10-CM | POA: Diagnosis not present

## 2018-07-18 DIAGNOSIS — J45909 Unspecified asthma, uncomplicated: Secondary | ICD-10-CM | POA: Diagnosis not present

## 2018-08-18 DIAGNOSIS — J45909 Unspecified asthma, uncomplicated: Secondary | ICD-10-CM | POA: Diagnosis not present

## 2018-08-19 DIAGNOSIS — R69 Illness, unspecified: Secondary | ICD-10-CM | POA: Diagnosis not present

## 2018-09-03 DIAGNOSIS — R69 Illness, unspecified: Secondary | ICD-10-CM | POA: Diagnosis not present

## 2018-09-17 DIAGNOSIS — J45909 Unspecified asthma, uncomplicated: Secondary | ICD-10-CM | POA: Diagnosis not present

## 2018-10-18 DIAGNOSIS — J45909 Unspecified asthma, uncomplicated: Secondary | ICD-10-CM | POA: Diagnosis not present

## 2018-10-28 DIAGNOSIS — Z6824 Body mass index (BMI) 24.0-24.9, adult: Secondary | ICD-10-CM | POA: Diagnosis not present

## 2018-10-28 DIAGNOSIS — I10 Essential (primary) hypertension: Secondary | ICD-10-CM | POA: Diagnosis not present

## 2018-10-28 DIAGNOSIS — E1165 Type 2 diabetes mellitus with hyperglycemia: Secondary | ICD-10-CM | POA: Diagnosis not present

## 2018-11-17 DIAGNOSIS — J45909 Unspecified asthma, uncomplicated: Secondary | ICD-10-CM | POA: Diagnosis not present

## 2018-12-18 DIAGNOSIS — J45909 Unspecified asthma, uncomplicated: Secondary | ICD-10-CM | POA: Diagnosis not present

## 2019-01-18 DIAGNOSIS — J45909 Unspecified asthma, uncomplicated: Secondary | ICD-10-CM | POA: Diagnosis not present

## 2019-02-03 DIAGNOSIS — E1165 Type 2 diabetes mellitus with hyperglycemia: Secondary | ICD-10-CM | POA: Diagnosis not present

## 2019-02-03 DIAGNOSIS — I10 Essential (primary) hypertension: Secondary | ICD-10-CM | POA: Diagnosis not present

## 2019-02-17 DIAGNOSIS — J45909 Unspecified asthma, uncomplicated: Secondary | ICD-10-CM | POA: Diagnosis not present

## 2019-03-11 DIAGNOSIS — Z13220 Encounter for screening for lipoid disorders: Secondary | ICD-10-CM | POA: Diagnosis not present

## 2019-03-11 DIAGNOSIS — I10 Essential (primary) hypertension: Secondary | ICD-10-CM | POA: Diagnosis not present

## 2019-03-11 DIAGNOSIS — E1165 Type 2 diabetes mellitus with hyperglycemia: Secondary | ICD-10-CM | POA: Diagnosis not present

## 2019-03-16 DIAGNOSIS — Z0000 Encounter for general adult medical examination without abnormal findings: Secondary | ICD-10-CM | POA: Diagnosis not present

## 2019-03-16 DIAGNOSIS — E1165 Type 2 diabetes mellitus with hyperglycemia: Secondary | ICD-10-CM | POA: Diagnosis not present

## 2019-03-16 DIAGNOSIS — I10 Essential (primary) hypertension: Secondary | ICD-10-CM | POA: Diagnosis not present

## 2019-03-16 DIAGNOSIS — R2681 Unsteadiness on feet: Secondary | ICD-10-CM | POA: Diagnosis not present

## 2019-03-16 DIAGNOSIS — Z23 Encounter for immunization: Secondary | ICD-10-CM | POA: Diagnosis not present

## 2019-03-16 DIAGNOSIS — Z6824 Body mass index (BMI) 24.0-24.9, adult: Secondary | ICD-10-CM | POA: Diagnosis not present

## 2019-03-20 DIAGNOSIS — E1165 Type 2 diabetes mellitus with hyperglycemia: Secondary | ICD-10-CM | POA: Diagnosis not present

## 2019-03-20 DIAGNOSIS — R2681 Unsteadiness on feet: Secondary | ICD-10-CM | POA: Diagnosis not present

## 2019-03-20 DIAGNOSIS — J45909 Unspecified asthma, uncomplicated: Secondary | ICD-10-CM | POA: Diagnosis not present

## 2019-03-22 ENCOUNTER — Encounter: Payer: Self-pay | Admitting: Gastroenterology

## 2019-04-08 ENCOUNTER — Encounter: Payer: Self-pay | Admitting: *Deleted

## 2019-04-08 ENCOUNTER — Encounter: Payer: Self-pay | Admitting: Gastroenterology

## 2019-04-08 ENCOUNTER — Ambulatory Visit: Payer: Medicare HMO | Admitting: Gastroenterology

## 2019-04-08 ENCOUNTER — Other Ambulatory Visit: Payer: Self-pay

## 2019-04-08 ENCOUNTER — Other Ambulatory Visit: Payer: Self-pay | Admitting: *Deleted

## 2019-04-08 DIAGNOSIS — I10 Essential (primary) hypertension: Secondary | ICD-10-CM | POA: Diagnosis not present

## 2019-04-08 DIAGNOSIS — D126 Benign neoplasm of colon, unspecified: Secondary | ICD-10-CM | POA: Insufficient documentation

## 2019-04-08 DIAGNOSIS — Z86010 Personal history of colonic polyps: Secondary | ICD-10-CM

## 2019-04-08 DIAGNOSIS — Z800 Family history of malignant neoplasm of digestive organs: Secondary | ICD-10-CM

## 2019-04-08 MED ORDER — PEG 3350-KCL-NA BICARB-NACL 420 G PO SOLR: ORAL | 0 refills | Status: DC

## 2019-04-08 NOTE — Patient Instructions (Signed)
DRINK WATER TO KEEP YOUR URINE LIGHT YELLOW.  FOLLOW A HIGH FIBER DIET. AVOID ITEMS THAT CAUSE BLOATING & GAS. SEE INFO BELOW.  COMPLETE COLONOSCOPY.  FOLLOW UP IN THE OFFICE WILL BE SCHEDULED IF NEEDED AFTER ENDOSCOPY.  High-Fiber Diet A high-fiber diet changes your normal diet to include more whole grains, legumes, fruits, and vegetables. Changes in the diet involve replacing refined carbohydrates with unrefined foods. The calorie level of the diet is essentially unchanged. The Dietary Reference Intake (recommended amount) for adult males is 38 grams per day. For adult females, it is 25 grams per day. Pregnant and lactating women should consume 28 grams of fiber per day. Fiber is the intact part of a plant that is not broken down during digestion. Functional fiber is fiber that has been isolated from the plant to provide a beneficial effect in the body.  PURPOSE  Increase stool bulk.   Ease and regulate bowel movements.   Lower cholesterol.   REDUCE RISK OF COLON CANCER  INDICATIONS THAT YOU NEED MORE FIBER  Constipation and hemorrhoids.   Uncomplicated diverticulosis (intestine condition) and irritable bowel syndrome.   Weight management.   As a protective measure against hardening of the arteries (atherosclerosis), diabetes, and cancer.   GUIDELINES FOR INCREASING FIBER IN THE DIET  Start adding fiber to the diet slowly. A gradual increase of about 5 more grams (2 servings of most fruits or vegetables) per day is best. Too rapid an increase in fiber may result in constipation, flatulence, and bloating.   Drink enough water and fluids to keep your urine clear or pale yellow. Water, juice, or caffeine-free drinks are recommended. Not drinking enough fluid may cause constipation.   Eat a variety of high-fiber foods rather than one type of fiber.   Try to increase your intake of fiber through using high-fiber foods rather than fiber pills or supplements that contain small  amounts of fiber.   The goal is to change the types of food eaten. Do not supplement your present diet with high-fiber foods, but replace foods in your present diet.

## 2019-04-08 NOTE — Assessment & Plan Note (Signed)
ELEVATED BP. PT DIDN'T TAKE BP MEDS THIS AM. BP NL AT PCP [facility] Mar 17, 2019.  PT REMINDED TO TAKE MEDS WHEN SHE GETS HOME.

## 2019-04-08 NOTE — Progress Notes (Signed)
Subjective:    Patient ID: [name], female    DOB: 1941/01/21, 78 y.o.   MRN: [id number]  Tiffany Bal, PA-C   HPI LAST COLONOSCOPY JUL 2009. HAD SIMPLE ADENOMAS. DID NOT RETURN FOR SURVEILLANCE. BMs: GOOD. APPETITE: GOOD. MAYVHAVE TROUBLE BREATHING. SMOKED YEARS AGO.   PT DENIES FEVER, CHILLS, HEMATOCHEZIA, HEMATEMESIS, nausea, vomiting, melena, diarrhea, CHEST PAIN, SHORTNESS OF BREATH,  CHANGE IN BOWEL IN HABITS, constipation, abdominal pain, problems swallowing, problems with sedation, heartburn or indigestion.  Past Medical History:  Diagnosis Date  . Asthma   . Diabetes ([place])   . HTN (hypertension)     Past Surgical History:  Procedure Laterality Date  . ABDOMINAL HYSTERECTOMY     PROLAPSED   . COLONOSCOPY W/ POLYPECTOMY  11/2007   SIMPLE ADENOMAS  . OVARIAN CYST REMOVAL     No Known Allergies  Current Outpatient Medications  Medication Sig    . albuterol (VENTOLIN HFA) 108 (90 Base) MCG/ACT inhaler Inhale 1-2 puffs into the lungs as needed.    . metFORMIN (GLUCOPHAGE) 500 MG tablet Take 500 mg by mouth daily.    . metoprolol succinate (TOPROL-XL) 25 MG 24 hr tablet Take 25 mg by mouth daily.    . montelukast (SINGULAIR) 10 MG tablet Take 1 tablet by mouth daily.    . Omega-3 Fatty Acids (OMEGA-3 FISH OIL PO) Take by mouth daily.    . simvastatin (ZOCOR) 10 MG tablet Take 10 mg by mouth at bedtime.    . SYMBICORT 160-4.5 MCG/ACT inhaler Inhale 2 puffs into the lungs 2 (two) times daily.     Family History  Problem Relation Age of Onset  . Colon cancer Mother        AGE > 47  . Colon polyps Neg Hx     Social History   Socioeconomic History  . Marital status: Divorced    Spouse name: Not on file  . Number of children: Not on file  . Years of education: Not on file  . Highest education level: Not on file  Occupational History  . Not on file  Social Needs  . Financial resource strain: Not on file  . Food insecurity    Worry: Not on file   Inability: Not on file  . Transportation needs    Medical: Not on file    Non-medical: Not on file  Tobacco Use  . Smoking status: Former Research scientist (life sciences)  . Smokeless tobacco: Never Used  Substance and Sexual Activity  . Alcohol use: Not Currently  . Drug use: Not Currently  . Sexual activity: Not on file  Lifestyle  . Physical activity    Days per week: Not on file    Minutes per session: Not on file  . Stress: Not on file  Relationships  . Social Herbalist on phone: Not on file    Gets together: Not on file    Attends religious service: Not on file    Active member of club or organization: Not on file    Attends meetings of clubs or organizations: Not on file    Relationship status: Not on file  Other Topics Concern  . Not on file  Social History Narrative   LIVES WITH DAUGHTER. DIVORCED. SPENDS FREE TIME: WORKING AROUND THE HOUSE, GARDENING.   Review of Systems PER HPI OTHERWISE ALL SYSTEMS ARE NEGATIVE.    Objective:   Physical Exam Constitutional:      General: She is not in acute distress.  Appearance: Normal appearance.  HENT:     Mouth/Throat:     Comments: MASK IN PLACE Eyes:     General: No scleral icterus.    Pupils: Pupils are equal, round, and reactive to light.  Neck:     Musculoskeletal: Normal range of motion.  Cardiovascular:     Rate and Rhythm: Normal rate and regular rhythm.     Pulses: Normal pulses.     Heart sounds: Normal heart sounds.  Pulmonary:     Effort: Pulmonary effort is normal.     Breath sounds: Normal breath sounds.  Abdominal:     General: Bowel sounds are normal.     Palpations: Abdomen is soft.     Tenderness: There is no abdominal tenderness.  Musculoskeletal:     Right lower leg: No edema.     Left lower leg: No edema.  Lymphadenopathy:     Cervical: No cervical adenopathy.  Skin:    General: Skin is warm and dry.  Neurological:     Mental Status: She is alert and oriented to person, place, and time.      Comments: NO  NEW FOCAL DEFICITS, HARD OF HEARING  Psychiatric:        Mood and Affect: Mood normal.     Comments: NORMAL AFFECT       Assessment & Plan:

## 2019-04-08 NOTE — Assessment & Plan Note (Addendum)
PERSONAL HISTORY OF POLYPS(> 3 simple adenomas removed in JUL 2009) and family HISTORY OF COLON CANCER. NO WARNING SIGNS/SYMPTOMS BUT PT IS OVERDUE FOR SURVEILLANCE.  DRINK WATER TO KEEP YOUR URINE LIGHT YELLOW. FOLLOW A HIGH FIBER DIET. AVOID ITEMS THAT CAUSE BLOATING & GAS.  HANDOUT GIVEN. COMPLETE COLONOSCOPY WITH FENTANYL. DISCUSSED PROCEDURE, BENEFITS, & RISKS: < 1% chance of medication reaction, bleeding, perforation, ASPIRATION, or rupture of spleen/liver requiring surgery to fix it and missed polyps < 1 cm 10-20% of the time. FOLLOW UP IN THE OFFICE WILL BE SCHEDULED IF NEEDED AFTER ENDOSCOPY.

## 2019-06-01 DIAGNOSIS — Z1231 Encounter for screening mammogram for malignant neoplasm of breast: Secondary | ICD-10-CM | POA: Diagnosis not present

## 2019-07-01 ENCOUNTER — Telehealth: Payer: Self-pay | Admitting: Gastroenterology

## 2019-07-01 NOTE — Telephone Encounter (Signed)
Pt's daughter called to let us know patient is diabetic and had questions about taking her iron pills. Daughter is aware prep was called into [facility] in Dec 2020 and it's probably been put back on the shelf after so many days. [phone] or [phone]

## 2019-07-01 NOTE — Telephone Encounter (Signed)
Called pt's daughter, she doesn't take Iron. She is aware to continue Metformin (per instructions). Rx will be ready tomorrow.

## 2019-07-02 ENCOUNTER — Other Ambulatory Visit (HOSPITAL_COMMUNITY)
Admission: RE | Admit: 2019-07-02 | Discharge: 2019-07-02 | Disposition: A | Discharge status: Home / Self Care | Payer: Medicare HMO | Source: Ambulatory Visit | Attending: Gastroenterology | Admitting: Gastroenterology

## 2019-07-02 ENCOUNTER — Other Ambulatory Visit: Payer: Self-pay

## 2019-07-02 DIAGNOSIS — I10 Essential (primary) hypertension: Secondary | ICD-10-CM | POA: Diagnosis not present

## 2019-07-02 DIAGNOSIS — E7849 Other hyperlipidemia: Secondary | ICD-10-CM | POA: Diagnosis not present

## 2019-07-02 DIAGNOSIS — Z20822 Contact with and (suspected) exposure to covid-19: Secondary | ICD-10-CM | POA: Insufficient documentation

## 2019-07-02 DIAGNOSIS — Z01812 Encounter for preprocedural laboratory examination: Secondary | ICD-10-CM | POA: Insufficient documentation

## 2019-07-02 LAB — SARS CORONAVIRUS 2 (TAT 6-24 HRS): SARS Coronavirus 2: NEGATIVE

## 2019-07-05 ENCOUNTER — Other Ambulatory Visit: Payer: Self-pay

## 2019-07-05 ENCOUNTER — Encounter (HOSPITAL_COMMUNITY)
Admission: RE | Disposition: A | Discharge status: Home / Self Care | Payer: Self-pay | Source: Home / Self Care | Attending: Gastroenterology

## 2019-07-05 ENCOUNTER — Encounter (HOSPITAL_COMMUNITY): Payer: Self-pay | Admitting: Gastroenterology

## 2019-07-05 ENCOUNTER — Ambulatory Visit (HOSPITAL_COMMUNITY)
Admission: RE | Admit: 2019-07-05 | Discharge: 2019-07-05 | Disposition: A | Discharge status: Home / Self Care | Payer: Medicare HMO | Attending: Gastroenterology | Admitting: Gastroenterology

## 2019-07-05 DIAGNOSIS — K635 Polyp of colon: Secondary | ICD-10-CM

## 2019-07-05 DIAGNOSIS — Z86010 Personal history of colonic polyps: Secondary | ICD-10-CM | POA: Diagnosis not present

## 2019-07-05 DIAGNOSIS — Z87891 Personal history of nicotine dependence: Secondary | ICD-10-CM | POA: Diagnosis not present

## 2019-07-05 DIAGNOSIS — Z7951 Long term (current) use of inhaled steroids: Secondary | ICD-10-CM | POA: Insufficient documentation

## 2019-07-05 DIAGNOSIS — J45909 Unspecified asthma, uncomplicated: Secondary | ICD-10-CM | POA: Insufficient documentation

## 2019-07-05 DIAGNOSIS — Q438 Other specified congenital malformations of intestine: Secondary | ICD-10-CM | POA: Insufficient documentation

## 2019-07-05 DIAGNOSIS — E785 Hyperlipidemia, unspecified: Secondary | ICD-10-CM | POA: Insufficient documentation

## 2019-07-05 DIAGNOSIS — I10 Essential (primary) hypertension: Secondary | ICD-10-CM | POA: Diagnosis not present

## 2019-07-05 DIAGNOSIS — K648 Other hemorrhoids: Secondary | ICD-10-CM | POA: Insufficient documentation

## 2019-07-05 DIAGNOSIS — Z79899 Other long term (current) drug therapy: Secondary | ICD-10-CM | POA: Insufficient documentation

## 2019-07-05 DIAGNOSIS — D123 Benign neoplasm of transverse colon: Secondary | ICD-10-CM | POA: Diagnosis not present

## 2019-07-05 DIAGNOSIS — D120 Benign neoplasm of cecum: Secondary | ICD-10-CM | POA: Insufficient documentation

## 2019-07-05 DIAGNOSIS — Z1211 Encounter for screening for malignant neoplasm of colon: Secondary | ICD-10-CM | POA: Diagnosis present

## 2019-07-05 DIAGNOSIS — K644 Residual hemorrhoidal skin tags: Secondary | ICD-10-CM | POA: Insufficient documentation

## 2019-07-05 DIAGNOSIS — E119 Type 2 diabetes mellitus without complications: Secondary | ICD-10-CM | POA: Diagnosis not present

## 2019-07-05 DIAGNOSIS — D125 Benign neoplasm of sigmoid colon: Secondary | ICD-10-CM | POA: Insufficient documentation

## 2019-07-05 DIAGNOSIS — Z7984 Long term (current) use of oral hypoglycemic drugs: Secondary | ICD-10-CM | POA: Insufficient documentation

## 2019-07-05 DIAGNOSIS — D124 Benign neoplasm of descending colon: Secondary | ICD-10-CM | POA: Diagnosis not present

## 2019-07-05 DIAGNOSIS — K5730 Diverticulosis of large intestine without perforation or abscess without bleeding: Secondary | ICD-10-CM | POA: Diagnosis not present

## 2019-07-05 DIAGNOSIS — Z800 Family history of malignant neoplasm of digestive organs: Secondary | ICD-10-CM

## 2019-07-05 HISTORY — PX: POLYPECTOMY: SHX5525

## 2019-07-05 HISTORY — PX: COLONOSCOPY: SHX5424

## 2019-07-05 LAB — GLUCOSE, CAPILLARY: Glucose-Capillary: 141 mg/dL — ABNORMAL HIGH (ref 70–99)

## 2019-07-05 SURGERY — COLONOSCOPY
Anesthesia: Moderate Sedation

## 2019-07-05 MED ORDER — STERILE WATER FOR IRRIGATION IR SOLN
Status: DC | PRN
  Administered 2019-07-05: 1.5 mL

## 2019-07-05 MED ORDER — MIDAZOLAM HCL 5 MG/5ML IJ SOLN
INTRAMUSCULAR | Status: DC | PRN
  Administered 2019-07-05: 1 mg via INTRAVENOUS
  Administered 2019-07-05: 2 mg via INTRAVENOUS
  Administered 2019-07-05: 1 mg via INTRAVENOUS

## 2019-07-05 MED ORDER — MIDAZOLAM HCL 5 MG/5ML IJ SOLN
INTRAMUSCULAR | Status: AC
  Filled 2019-07-05: qty 10

## 2019-07-05 MED ORDER — MEPERIDINE HCL 100 MG/ML IJ SOLN
INTRAMUSCULAR | Status: AC
  Filled 2019-07-05: qty 2

## 2019-07-05 MED ORDER — SODIUM CHLORIDE 0.9 % IV SOLN: INTRAVENOUS | Status: DC

## 2019-07-05 MED ORDER — MEPERIDINE HCL 100 MG/ML IJ SOLN
INTRAMUSCULAR | Status: DC | PRN
  Administered 2019-07-05 (×2): 25 mg via INTRAVENOUS

## 2019-07-05 NOTE — Op Note (Signed)
[facility] Patient Name: [name] Procedure Date: 07/05/2019 8:48 AM MRN: [id number] Date of Birth: [birth year] Attending MD: Barney Drain MD, MD CSN: [id number] Age: 79 Admit Type: Outpatient Procedure:                Colonoscopy WITH COLD SNARE POLYPECTOMY Indications:              Personal history of colonic polyps Providers:                Barney Drain MD, MD, Otis Peak B. Sharon Seller, RN, Aram Candela Referring MD:             [name] Medicines:                Meperidine 50 mg IV, Midazolam 4 mg IV Complications:            No immediate complications. Estimated Blood Loss:     Estimated blood loss was minimal. Procedure:                Pre-Anesthesia Assessment:                           - Prior to the procedure, a History and Physical                            was performed, and patient medications and                            allergies were reviewed. The patient's tolerance of                            previous anesthesia was also reviewed. The risks                            and benefits of the procedure and the sedation                            options and risks were discussed with the patient.                            All questions were answered, and informed consent                            was obtained. Prior Anticoagulants: The patient has                            taken no previous anticoagulant or antiplatelet                            agents. ASA Grade Assessment: II - A patient with                            mild systemic disease. After reviewing the risks  and benefits, the patient was deemed in                            satisfactory condition to undergo the procedure.                            After obtaining informed consent, the colonoscope                            was passed under direct vision. Throughout the                            procedure, the patient's blood pressure, pulse, and                             oxygen saturations were monitored continuously. The                            [id number] scope was introduced through                            the anus and advanced to the the cecum, identified                            by appendiceal orifice and ileocecal valve. The                            colonoscopy was performed with difficulty due to                            restricted mobility of the colon and a tortuous                            colon. Successful completion of the procedure was                            aided by increasing the dose of sedation                            medication, changing endoscopes, straightening and                            shortening the scope to obtain bowel loop reduction                            and COLOWRAP. The patient tolerated the procedure                            well. The quality of the bowel preparation was                            excellent. The ileocecal valve, appendiceal  orifice, and rectum were photographed. Scope In: 9:03:47 AM Scope Out: [id number] AM Scope Withdrawal Time: 0 hours 36 minutes 8 seconds  Total Procedure Duration: 0 hours 42 minutes 31 seconds  Findings:      Four sessile polyps were found in the cecum and ileocecal valve. The       polyps were 3 to 10 mm in size. These polyps were removed with a cold       snare. Resection and retrieval were complete.      Six sessile polyps were found in the hepatic flexure. The polyps were 4       to 6 mm in size. These polyps were removed with a cold snare. Resection       and retrieval were complete.      Nine sessile polyps were found in the sigmoid colon, descending colon       and distal transverse colon. The polyps were 4 to 6 mm in size. These       polyps were removed with a cold snare. Resection and retrieval were       complete.      Multiple small and large-mouthed diverticula were found in the        recto-sigmoid colon, sigmoid colon and descending colon.      External and internal hemorrhoids were found. The hemorrhoids were small.      The recto-sigmoid colon, sigmoid colon, descending colon and splenic       flexure were grossly tortuous. Impression:               - Four 3 to 10 mm polyps in the cecum(3) and at the                            ileocecal valve, removed with a cold snare.                            Resected and retrieved.                           - Six 4 to 6 mm polyps at the hepatic flexure,                            removed with a cold snare. Resected and retrieved.                           - Nine 4 to 6 mm polyps in the sigmoid colon(4), in                            the descending colon(4) and in the distal                            transverse colon, removed with a cold snare.                            Resected and retrieved.                           - Diverticulosis in the recto-sigmoid colon, in the  sigmoid colon and in the descending colon.                           - External and internal hemorrhoids.                           - Tortuous colon. Moderate Sedation:      Moderate (conscious) sedation was administered by the endoscopy nurse       and supervised by the endoscopist. The following parameters were       monitored: oxygen saturation, heart rate, blood pressure, and response       to care. Total physician intraservice time was 56 minutes. Recommendation:           - Patient has a contact number available for                            emergencies. The signs and symptoms of potential                            delayed complications were discussed with the                            patient. Return to normal activities tomorrow.                            Written discharge instructions were provided to the                            patient.                           - High fiber diet.                           - Continue  present medications.                           - Await pathology results.                           - Repeat colonoscopy is not recommended due to                            current age (56 years or older) for surveillance. Procedure Code(s):        --- Professional ---                           [phone], Colonoscopy, flexible; with removal of                            tumor(s), polyp(s), or other lesion(s) by snare                            technique  [id number], Moderate sedation; each additional 15                            minutes intraservice time                           99153, Moderate sedation; each additional 15                            minutes intraservice time                           99153, Moderate sedation; each additional 15                            minutes intraservice time                           G0500, Moderate sedation services provided by the                            same physician or other qualified health care                            professional performing a gastrointestinal                            endoscopic service that sedation supports,                            requiring the presence of an independent trained                            observer to assist in the monitoring of the                            patient's level of consciousness and physiological                            status; initial 15 minutes of intra-service time;                            patient age 74 years or older (additional time may                            be reported with [phone], as appropriate) Diagnosis Code(s):        --- Professional ---                           K63.5, Polyp of colon                           K64.8, Other hemorrhoids                           Z86.010, Personal history of colonic polyps  K57.30, Diverticulosis of large intestine without                            perforation or abscess without  bleeding                           Q43.8, Other specified congenital malformations of                            intestine CPT copyright 2019 [employer]. All rights reserved. The codes documented in this report are preliminary and upon coder review may  be revised to meet current compliance requirements. Barney Drain, MD Barney Drain MD, MD 07/05/2019 10:17:15 AM This report has been signed electronically. Number of Addenda: 0

## 2019-07-05 NOTE — Discharge Instructions (Signed)
You have small internal hemorrhoids and diverticulosis IN YOUR LEFT COLON. YOU HAD 18 POLYPS REMOVED.    DRINK WATER TO KEEP YOUR URINE LIGHT YELLOW.  FOLLOW A HIGH FIBER DIET. AVOID ITEMS THAT CAUSE BLOATING. See info below.   USE PREPARATION H FOUR TIMES  A DAY IF NEEDED TO RELIEVE RECTAL PAIN/PRESSURE/BLEEDING.   YOUR BIOPSY RESULTS WILL BE BACK IN 5 BUSINESS DAYS.  We do not routinely screen for polyps after the age of 56.  Colonoscopy Care After Read the instructions outlined below and refer to this sheet in the next week. These discharge instructions provide you with general information on caring for yourself after you leave the hospital. While your treatment has been planned according to the most current medical practices available, unavoidable complications occasionally occur. If you have any problems or questions after discharge, call DR. FIELDS, [phone].  ACTIVITY  You may resume your regular activity, but move at a slower pace for the next 24 hours.   Take frequent rest periods for the next 24 hours.   Walking will help get rid of the air and reduce the bloated feeling in your belly (abdomen).   No driving for 24 hours (because of the medicine (anesthesia) used during the test).   You may shower.   Do not sign any important legal documents or operate any machinery for 24 hours (because of the anesthesia used during the test).    NUTRITION  Drink plenty of fluids.   You may resume your normal diet as instructed by your doctor.   Begin with a light meal and progress to your normal diet. Heavy or fried foods are harder to digest and may make you feel sick to your stomach (nauseated).   Avoid alcoholic beverages for 24 hours or as instructed.    MEDICATIONS  You may resume your normal medications.   WHAT YOU CAN EXPECT TODAY  Some feelings of bloating in the abdomen.   Passage of more gas than usual.   Spotting of blood in your stool or on the  toilet paper  .  IF YOU HAD POLYPS REMOVED DURING THE COLONOSCOPY:  Eat a soft diet IF YOU HAVE NAUSEA, BLOATING, ABDOMINAL PAIN, OR VOMITING.    FINDING OUT THE RESULTS OF YOUR TEST Not all test results are available during your visit. DR. Oneida Alar WILL CALL YOU WITHIN 14 DAYS OF YOUR PROCEDUE WITH YOUR RESULTS. Do not assume everything is normal if you have not heard from DR. FIELDS, CALL HER OFFICE AT [phone].  SEEK IMMEDIATE MEDICAL ATTENTION AND CALL THE OFFICE: [phone] IF:  You have more than a spotting of blood in your stool.   Your belly is swollen (abdominal distention).   You are nauseated or vomiting.   You have a temperature over 101F.   You have abdominal pain or discomfort that is severe or gets worse throughout the day.  High-Fiber Diet A high-fiber diet changes your normal diet to include more whole grains, legumes, fruits, and vegetables. Changes in the diet involve replacing refined carbohydrates with unrefined foods. The calorie level of the diet is essentially unchanged. The Dietary Reference Intake (recommended amount) for adult males is 38 grams per day. For adult females, it is 25 grams per day. Pregnant and lactating women should consume 28 grams of fiber per day. Fiber is the intact part of a plant that is not broken down during digestion. Functional fiber is fiber that has been isolated from the plant to provide a beneficial effect  in the body.  PURPOSE  Increase stool bulk.   Ease and regulate bowel movements.   Lower cholesterol.   REDUCE RISK OF COLON CANCER  INDICATIONS THAT YOU NEED MORE FIBER  Constipation and hemorrhoids.   Uncomplicated diverticulosis (intestine condition) and irritable bowel syndrome.   Weight management.   As a protective measure against hardening of the arteries (atherosclerosis), diabetes, and cancer.   GUIDELINES FOR INCREASING FIBER IN THE DIET  Start adding fiber to the diet slowly. A gradual increase of  about 5 more grams (2 servings of most fruits or vegetables) per day is best. Too rapid an increase in fiber may result in constipation, flatulence, and bloating.   Drink enough water and fluids to keep your urine clear or pale yellow. Water, juice, or caffeine-free drinks are recommended. Not drinking enough fluid may cause constipation.   Eat a variety of high-fiber foods rather than one type of fiber.   Try to increase your intake of fiber through using high-fiber foods rather than fiber pills or supplements that contain small amounts of fiber.   The goal is to change the types of food eaten. Do not supplement your present diet with high-fiber foods, but replace foods in your present diet.   Polyps, Colon  A polyp is extra tissue that grows inside your body. Colon polyps grow in the large intestine. The large intestine, also called the colon, is part of your digestive system. It is a long, hollow tube at the end of your digestive tract where your body makes and stores stool. Most polyps are not dangerous. They are benign. This means they are not cancerous. But over time, some types of polyps can turn into cancer. Polyps that are smaller than a pea are usually not harmful. But larger polyps could someday become or may already be cancerous. To be safe, doctors remove all polyps and test them.   PREVENTION There is not one sure way to prevent polyps. You might be able to lower your risk of getting them if you:  Eat more fruits and vegetables and less fatty food.   Do not smoke.   Avoid alcohol.   Exercise every day.   Lose weight if you are overweight.   Eating more calcium and folate can also lower your risk of getting polyps. Some foods that are rich in calcium are milk, cheese, and broccoli. Some foods that are rich in folate are chickpeas, kidney beans, and spinach.    Diverticulosis Diverticulosis is a common condition that develops when small pouches (diverticula) form in the wall  of the colon. The risk of diverticulosis increases with age. It happens more often in people who eat a low-fiber diet. Most individuals with diverticulosis have no symptoms. Those individuals with symptoms usually experience belly (abdominal) pain, constipation, or loose stools (diarrhea).  HOME CARE INSTRUCTIONS  Increase the amount of fiber in your diet as directed by your caregiver or dietician. This may reduce symptoms of diverticulosis.   Drink at least 6 to 8 glasses of water each day to prevent constipation.   Try not to strain when you have a bowel movement.   Avoiding nuts and seeds to prevent complications is NOT NECESSARY.   FOODS HAVING HIGH FIBER CONTENT INCLUDE:  Fruits. Apple, peach, pear, tangerine, raisins, prunes.   Vegetables. Brussels sprouts, asparagus, broccoli, cabbage, carrot, cauliflower, romaine lettuce, spinach, summer squash, tomato, winter squash, zucchini.   Starchy Vegetables. Baked beans, kidney beans, lima beans, split peas, lentils, potatoes (with  skin).    SEEK IMMEDIATE MEDICAL CARE IF:  You develop increasing pain or severe bloating.   You have an oral temperature above 101F.   You develop vomiting or bowel movements that are bloody or black.

## 2019-07-05 NOTE — H&P (Signed)
Primary Care Physician:  [name], PA-C Primary Gastroenterologist:  Dr. Oneida Alar  Pre-Procedure History & Physical: HPI:  [name] is a 79 y.o. female here for  PERSONAL HISTORY OF POLYPS.  Past Medical History:  Diagnosis Date  . Asthma   . Diabetes ([place])   . HTN (hypertension)   . Hyperlipemia     Past Surgical History:  Procedure Laterality Date  . ABDOMINAL HYSTERECTOMY     PROLAPSED   . COLONOSCOPY W/ POLYPECTOMY  11/2007   SIMPLE ADENOMAS  . OVARIAN CYST REMOVAL      Prior to Admission medications   Medication Sig Start Date End Date Taking? Authorizing Provider  albuterol (VENTOLIN HFA) 108 (90 Base) MCG/ACT inhaler Inhale 1-2 puffs into the lungs every 6 (six) hours as needed for wheezing or shortness of breath.  03/17/19  Yes [provider]  Cyanocobalamin (VITAMIN B-12 PO) Take 1 tablet by mouth 3 (three) times a week.   Yes [provider]  metFORMIN (GLUCOPHAGE) 500 MG tablet Take 500 mg by mouth daily. 03/16/19  Yes [provider]  metoprolol succinate (TOPROL-XL) 25 MG 24 hr tablet Take 25 mg by mouth daily. 03/17/19  Yes [provider]  Omega-3 Fatty Acids (OMEGA-3 FISH OIL PO) Take 1 capsule by mouth daily.    Yes [provider]  polyethylene glycol-electrolytes (NULYTELY/GOLYTELY) 420 g solution As directed 04/08/19  Yes Roddrick Sharron L, MD  simvastatin (ZOCOR) 10 MG tablet Take 10 mg by mouth at bedtime. 03/16/19  Yes [provider]  SYMBICORT 160-4.5 MCG/ACT inhaler Inhale 2 puffs into the lungs 2 (two) times daily. 10/26/18  Yes [provider]    Allergies as of 04/08/2019  . (No Known Allergies)    Family History  Problem Relation Age of Onset  . Colon cancer Mother        AGE > 5  . Colon polyps Neg Hx     Social History   Socioeconomic History  . Marital status: Divorced    Spouse name: Not on file  . Number of children: Not on file  . Years of education: Not on file   . Highest education level: Not on file  Occupational History  . Not on file  Tobacco Use  . Smoking status: Former Research scientist (life sciences)  . Smokeless tobacco: Never Used  Substance and Sexual Activity  . Alcohol use: Not Currently  . Drug use: Not Currently  . Sexual activity: Not on file  Other Topics Concern  . Not on file  Social History Narrative   LIVES WITH DAUGHTER. DIVORCED. SPENDS FREE TIME: WORKING AROUND THE HOUSE, GARDENING.   Social Determinants of Health   Financial Resource Strain:   . Difficulty of Paying Living Expenses: Not on file  Food Insecurity:   . Worried About Charity fundraiser in the Last Year: Not on file  . Ran Out of Food in the Last Year: Not on file  Transportation Needs:   . Lack of Transportation (Medical): Not on file  . Lack of Transportation (Non-Medical): Not on file  Physical Activity:   . Days of Exercise per Week: Not on file  . Minutes of Exercise per Session: Not on file  Stress:   . Feeling of Stress : Not on file  Social Connections:   . Frequency of Communication with Friends and Family: Not on file  . Frequency of Social Gatherings with Friends and Family: Not on file  . Attends Religious Services: Not on  file  . Active Member of Clubs or Organizations: Not on file  . Attends Archivist Meetings: Not on file  . Marital Status: Not on file  Intimate Partner Violence:   . Fear of Current or Ex-Partner: Not on file  . Emotionally Abused: Not on file  . Physically Abused: Not on file  . Sexually Abused: Not on file    Review of Systems: See HPI, otherwise negative ROS   Physical Exam: BP (!) 190/71   Pulse 66   Temp 98.8 F (37.1 C) (Oral)   Resp 19   Ht 4\' 11"  (1.499 m)   Wt 52.2 kg   SpO2 100%   BMI 23.23 kg/m  General:   Alert,  pleasant and cooperative in NAD Head:  Normocephalic and atraumatic. Neck:  Supple; Lungs:  Clear throughout to auscultation.    Heart:  Regular rate and rhythm. Abdomen:  Soft,  nontender and nondistended. Normal bowel sounds, without guarding, and without rebound.   Neurologic:  Alert and  oriented x4;  grossly normal neurologically.  Impression/Plan:      PERSONAL HISTORY OF POLYPS.  PLAN: 1. TCS TODAY. DISCUSSED PROCEDURE, BENEFITS, & RISKS: < 1% chance of medication reaction, bleeding, perforation, ASPIRATION, or rupture of spleen/liver requiring surgery to fix it and missed polyps < 1 cm 10-20% of the time.

## 2019-07-06 LAB — SURGICAL PATHOLOGY

## 2019-07-08 DIAGNOSIS — R946 Abnormal results of thyroid function studies: Secondary | ICD-10-CM | POA: Diagnosis not present

## 2019-07-08 DIAGNOSIS — I10 Essential (primary) hypertension: Secondary | ICD-10-CM | POA: Diagnosis not present

## 2019-07-08 DIAGNOSIS — Z13220 Encounter for screening for lipoid disorders: Secondary | ICD-10-CM | POA: Diagnosis not present

## 2019-07-08 DIAGNOSIS — E1165 Type 2 diabetes mellitus with hyperglycemia: Secondary | ICD-10-CM | POA: Diagnosis not present

## 2019-07-12 DIAGNOSIS — Z0000 Encounter for general adult medical examination without abnormal findings: Secondary | ICD-10-CM | POA: Diagnosis not present

## 2019-07-12 DIAGNOSIS — E1165 Type 2 diabetes mellitus with hyperglycemia: Secondary | ICD-10-CM | POA: Diagnosis not present

## 2019-07-12 DIAGNOSIS — R300 Dysuria: Secondary | ICD-10-CM | POA: Diagnosis not present

## 2019-07-12 DIAGNOSIS — Z6824 Body mass index (BMI) 24.0-24.9, adult: Secondary | ICD-10-CM | POA: Diagnosis not present

## 2019-07-12 DIAGNOSIS — I10 Essential (primary) hypertension: Secondary | ICD-10-CM | POA: Diagnosis not present

## 2019-07-12 DIAGNOSIS — Z23 Encounter for immunization: Secondary | ICD-10-CM | POA: Diagnosis not present

## 2019-07-14 ENCOUNTER — Telehealth: Payer: Self-pay | Admitting: Gastroenterology

## 2019-07-14 NOTE — Telephone Encounter (Signed)
PLEASE CALL PT. SHE HAD 18 SIMPLE ADENOMAS REMOVED.   DRINK WATER TO KEEP YOUR URINE LIGHT YELLOW. FOLLOW A HIGH FIBER DIET. AVOID ITEMS THAT CAUSE BLOATING & GAS.  YOUR CHILDREN NEED TO HAVE A COLONOSCOPY STARTING AT THE AGE OF 45.  We do not routinely screen for polyps after the age of 85.

## 2019-07-14 NOTE — Telephone Encounter (Signed)
Left message with lady for pt to call us back.

## 2019-07-15 ENCOUNTER — Encounter: Payer: Self-pay | Admitting: *Deleted

## 2019-07-15 NOTE — Telephone Encounter (Signed)
Tried to call pt but vm full.  Could not leave a message.

## 2019-07-15 NOTE — Progress Notes (Signed)
De 

## 2019-07-15 NOTE — Telephone Encounter (Signed)
Unable to reach pt by phone.  Mailed letter with results and recommendations to pt.  Also mailed high fiber diet information to pt.

## 2019-07-15 NOTE — Telephone Encounter (Signed)
Tried to call pt but no answer

## 2019-07-22 DIAGNOSIS — E2839 Other primary ovarian failure: Secondary | ICD-10-CM | POA: Diagnosis not present

## 2019-07-22 DIAGNOSIS — M859 Disorder of bone density and structure, unspecified: Secondary | ICD-10-CM | POA: Diagnosis not present

## 2019-07-22 DIAGNOSIS — M810 Age-related osteoporosis without current pathological fracture: Secondary | ICD-10-CM | POA: Diagnosis not present

## 2019-07-29 ENCOUNTER — Telehealth: Payer: Self-pay

## 2019-07-29 NOTE — Telephone Encounter (Signed)
VM received, pts daughter wanted to discuss her mothers results. Spoke pts daughter and she was aware of the results and pts daughter received letter that was mailed on 07/15/2019.

## 2019-08-04 DIAGNOSIS — E7849 Other hyperlipidemia: Secondary | ICD-10-CM | POA: Diagnosis not present

## 2019-08-04 DIAGNOSIS — I10 Essential (primary) hypertension: Secondary | ICD-10-CM | POA: Diagnosis not present

## 2019-09-03 DIAGNOSIS — E7849 Other hyperlipidemia: Secondary | ICD-10-CM | POA: Diagnosis not present

## 2019-09-03 DIAGNOSIS — I10 Essential (primary) hypertension: Secondary | ICD-10-CM | POA: Diagnosis not present

## 2020-02-02 DIAGNOSIS — Z20828 Contact with and (suspected) exposure to other viral communicable diseases: Secondary | ICD-10-CM | POA: Diagnosis not present

## 2020-05-01 DIAGNOSIS — Z23 Encounter for immunization: Secondary | ICD-10-CM | POA: Diagnosis not present

## 2020-06-01 DIAGNOSIS — Z23 Encounter for immunization: Secondary | ICD-10-CM | POA: Diagnosis not present

## 2020-06-20 ENCOUNTER — Observation Stay (HOSPITAL_COMMUNITY)
Admission: EM | Admit: 2020-06-20 | Discharge: 2020-06-21 | Disposition: A | Discharge status: Home / Self Care | Payer: Medicare HMO | Attending: Family Medicine | Admitting: Family Medicine

## 2020-06-20 ENCOUNTER — Encounter (HOSPITAL_COMMUNITY): Payer: Self-pay | Admitting: *Deleted

## 2020-06-20 ENCOUNTER — Other Ambulatory Visit: Payer: Self-pay

## 2020-06-20 ENCOUNTER — Emergency Department (HOSPITAL_COMMUNITY): Payer: Medicare HMO

## 2020-06-20 DIAGNOSIS — R197 Diarrhea, unspecified: Secondary | ICD-10-CM | POA: Diagnosis not present

## 2020-06-20 DIAGNOSIS — Z87891 Personal history of nicotine dependence: Secondary | ICD-10-CM | POA: Insufficient documentation

## 2020-06-20 DIAGNOSIS — E119 Type 2 diabetes mellitus without complications: Secondary | ICD-10-CM | POA: Diagnosis not present

## 2020-06-20 DIAGNOSIS — I10 Essential (primary) hypertension: Secondary | ICD-10-CM | POA: Diagnosis present

## 2020-06-20 DIAGNOSIS — R0789 Other chest pain: Principal | ICD-10-CM | POA: Insufficient documentation

## 2020-06-20 DIAGNOSIS — Z79899 Other long term (current) drug therapy: Secondary | ICD-10-CM | POA: Diagnosis not present

## 2020-06-20 DIAGNOSIS — Z7984 Long term (current) use of oral hypoglycemic drugs: Secondary | ICD-10-CM | POA: Insufficient documentation

## 2020-06-20 DIAGNOSIS — E785 Hyperlipidemia, unspecified: Secondary | ICD-10-CM

## 2020-06-20 DIAGNOSIS — Z20822 Contact with and (suspected) exposure to covid-19: Secondary | ICD-10-CM | POA: Insufficient documentation

## 2020-06-20 DIAGNOSIS — J45909 Unspecified asthma, uncomplicated: Secondary | ICD-10-CM | POA: Diagnosis not present

## 2020-06-20 DIAGNOSIS — R079 Chest pain, unspecified: Secondary | ICD-10-CM

## 2020-06-20 DIAGNOSIS — E1165 Type 2 diabetes mellitus with hyperglycemia: Secondary | ICD-10-CM

## 2020-06-20 DIAGNOSIS — I160 Hypertensive urgency: Secondary | ICD-10-CM

## 2020-06-20 LAB — RESP PANEL BY RT-PCR (FLU A&B, COVID) ARPGX2
Influenza A by PCR: NEGATIVE
Influenza B by PCR: NEGATIVE
SARS Coronavirus 2 by RT PCR: NEGATIVE

## 2020-06-20 LAB — BASIC METABOLIC PANEL
Anion gap: 9 (ref 5–15)
BUN: 19 mg/dL (ref 8–23)
CO2: 20 mmol/L — ABNORMAL LOW (ref 22–32)
Calcium: 9.3 mg/dL (ref 8.9–10.3)
Chloride: 105 mmol/L (ref 98–111)
Creatinine, Ser: 0.98 mg/dL (ref 0.44–1.00)
GFR, Estimated: 59 mL/min — ABNORMAL LOW (ref >60)
Glucose, Bld: 184 mg/dL — ABNORMAL HIGH (ref 70–99)
Potassium: 4.4 mmol/L (ref 3.5–5.1)
Sodium: 134 mmol/L — ABNORMAL LOW (ref 135–145)

## 2020-06-20 LAB — CBC
HCT: 39 % (ref 36.0–46.0)
Hemoglobin: 12.6 g/dL (ref 12.0–15.0)
MCH: 29.9 pg (ref 26.0–34.0)
MCHC: 32.3 g/dL (ref 30.0–36.0)
MCV: 92.4 fL (ref 80.0–100.0)
Platelets: 368 10*3/uL (ref 150–400)
RBC: 4.22 MIL/uL (ref 3.87–5.11)
RDW: 12.1 % (ref 11.5–15.5)
WBC: 7.4 10*3/uL (ref 4.0–10.5)
nRBC: 0 % (ref 0.0–0.2)

## 2020-06-20 LAB — TROPONIN I (HIGH SENSITIVITY)
Troponin I (High Sensitivity): 10 ng/L (ref <18)
Troponin I (High Sensitivity): 24 ng/L — ABNORMAL HIGH (ref <18)

## 2020-06-20 MED ORDER — ASPIRIN 81 MG PO CHEW
324.0000 mg | CHEWABLE_TABLET | Freq: Once | ORAL | Status: AC
  Administered 2020-06-20: 324 mg via ORAL
  Filled 2020-06-20: qty 4

## 2020-06-20 MED ORDER — NITROGLYCERIN 0.4 MG SL SUBL
0.4000 mg | SUBLINGUAL_TABLET | SUBLINGUAL | Status: DC | PRN
  Administered 2020-06-20 (×2): 0.4 mg via SUBLINGUAL
  Filled 2020-06-20: qty 1

## 2020-06-20 MED ORDER — HYDRALAZINE HCL 20 MG/ML IJ SOLN
10.0000 mg | Freq: Once | INTRAMUSCULAR | Status: DC
  Filled 2020-06-20: qty 1

## 2020-06-20 MED ORDER — SODIUM CHLORIDE 0.9 % IV SOLN: Freq: Once | INTRAVENOUS | Status: AC

## 2020-06-20 NOTE — ED Provider Notes (Signed)
[facility] Provider Note   CSN: [id number] Arrival date & time: 06/20/20  1642     History Chief Complaint  Patient presents with  . Chest Pain    [name] is a 80 y.o. female with a history of asthma, diabetes, hypertension and hyperlipidemia presenting with a 1 week history of left-sided chest pressure which radiates into her left scapular region.  She describes intermittent episodes of pressure sensation lasting from 1 to 10 minutes intermittently, randomly, occurring at rest and with ambulation.  She denies having symptoms similar to this prior to the past week.  Her symptoms became more frequent and more intense today hence her presentation here.  She denies shortness of breath, wheezing, cough, also no fevers or chills.  She also denies nausea or vomiting, diaphoresis or palpitations.  She has had some diarrhea for the past 2 days, stating having 4 episodes of nonbloody watery stools since yesterday.  She has had no medication prior to arrival for her symptoms.  She denies missing any doses of her blood pressure medication.  Her blood pressure is noted to be very elevated here.  Additionally she denies headache, vision changes, peripheral edema, leg pain, she denies pleuritic symptoms and her pain is not worsened with movement.  She is Covid vaccinated Moderna x 2, has not had the booster.  HPI     Past Medical History:  Diagnosis Date  . Asthma   . Diabetes ([place])   . HTN (hypertension)   . Hyperlipemia     Patient Active Problem List   Diagnosis Date Noted  . History of colonic polyps   . Adenomatous polyp of colon 04/08/2019  . HYPERLIPIDEMIA-MIXED 10/20/2008  . NECK PAIN 10/12/2008  . HEARTBURN 10/12/2008  . LEUKOCYTOSIS 12/22/2007  . HYPERGLYCEMIA 12/22/2007  . Essential hypertension 10/28/2007  . SENILE OSTEOPOROSIS 10/28/2007    Past Surgical History:  Procedure Laterality Date  . ABDOMINAL HYSTERECTOMY     PROLAPSED   . COLONOSCOPY  N/A 07/05/2019   Procedure: COLONOSCOPY;  Surgeon: Danie Binder, MD;  Location: [facility];  Service: Endoscopy;  Laterality: N/A;  1:00pm  . COLONOSCOPY W/ POLYPECTOMY  11/2007   SIMPLE ADENOMAS  . OVARIAN CYST REMOVAL    . POLYPECTOMY  07/05/2019   Procedure: POLYPECTOMY;  Surgeon: Danie Binder, MD;  Location: [facility];  Service: Endoscopy;;     OB History   No obstetric history on file.     Family History  Problem Relation Age of Onset  . Colon cancer Mother        AGE > 31  . Colon polyps Neg Hx     Social History   Tobacco Use  . Smoking status: Former Research scientist (life sciences)  . Smokeless tobacco: Never Used  Vaping Use  . Vaping Use: Never used  Substance Use Topics  . Alcohol use: Not Currently  . Drug use: Not Currently    Home Medications Prior to Admission medications   Medication Sig Start Date End Date Taking? Authorizing Provider  albuterol (VENTOLIN HFA) 108 (90 Base) MCG/ACT inhaler Inhale 1-2 puffs into the lungs every 6 (six) hours as needed for wheezing or shortness of breath.  03/17/19  Yes [provider]  Cyanocobalamin (VITAMIN B-12 PO) Take 1 tablet by mouth 3 (three) times a week.   Yes [provider]  metFORMIN (GLUCOPHAGE) 500 MG tablet Take 500 mg by mouth daily. 03/16/19  Yes [provider]  metoprolol succinate (TOPROL-XL) 25  MG 24 hr tablet Take 25 mg by mouth daily. 03/17/19  Yes [provider]  simvastatin (ZOCOR) 10 MG tablet Take 10 mg by mouth at bedtime. 03/16/19  Yes [provider]  SYMBICORT 160-4.5 MCG/ACT inhaler Inhale 2 puffs into the lungs 2 (two) times daily. 10/26/18  Yes [provider]  montelukast (SINGULAIR) 10 MG tablet  02/09/20   [provider]  Omega-3 Fatty Acids (OMEGA-3 FISH OIL PO) Take 1 capsule by mouth daily.  Patient not taking: No sig reported    [provider]    Allergies    Patient has no known allergies.  Review of Systems   Review  of Systems  Constitutional: Negative for chills and fever.  HENT: Negative for congestion and sore throat.   Eyes: Negative.   Respiratory: Positive for chest tightness. Negative for cough and shortness of breath.   Cardiovascular: Positive for chest pain. Negative for palpitations and leg swelling.  Gastrointestinal: Positive for diarrhea. Negative for abdominal pain, nausea and vomiting.  Genitourinary: Negative.   Musculoskeletal: Negative for arthralgias, joint swelling and neck pain.  Skin: Negative.  Negative for rash and wound.  Neurological: Negative for dizziness, weakness, light-headedness, numbness and headaches.  Psychiatric/Behavioral: Negative.     Physical Exam Updated Vital Signs BP (!) 158/72   Pulse (!) 51   Temp 98.3 F (36.8 C) (Oral)   Resp 20   SpO2 100%   Physical Exam Vitals and nursing note reviewed.  Constitutional:      Appearance: She is well-developed and well-nourished.  HENT:     Head: Normocephalic and atraumatic.  Eyes:     Conjunctiva/sclera: Conjunctivae normal.  Cardiovascular:     Rate and Rhythm: Normal rate and regular rhythm.     Pulses: Intact distal pulses.     Heart sounds: Normal heart sounds.  Pulmonary:     Effort: Pulmonary effort is normal.     Breath sounds: Normal breath sounds. No decreased breath sounds, wheezing or rhonchi.  Abdominal:     General: Bowel sounds are normal. There is distension.     Palpations: Abdomen is soft.     Tenderness: There is no abdominal tenderness. There is no guarding or rebound.     Comments: Increased tympany to percussion abdomen, no pain.  Musculoskeletal:        General: Normal range of motion.     Cervical back: Normal range of motion.  Skin:    General: Skin is warm and dry.  Neurological:     Mental Status: She is alert.  Psychiatric:        Mood and Affect: Mood and affect normal.     ED Results / Procedures / Treatments   Labs (all labs ordered are listed, but only  abnormal results are displayed) Labs Reviewed  BASIC METABOLIC PANEL - Abnormal; Notable for the following components:      Result Value   Sodium 134 (*)    CO2 20 (*)    Glucose, Bld 184 (*)    GFR, Estimated 59 (*)    All other components within normal limits  TROPONIN I (HIGH SENSITIVITY) - Abnormal; Notable for the following components:   Troponin I (High Sensitivity) 24 (*)    All other components within normal limits  RESP PANEL BY RT-PCR (FLU A&B, COVID) ARPGX2  CBC  TROPONIN I (HIGH SENSITIVITY)    EKG EKG Interpretation  Date/Time:  Tuesday June 20 2020 16:48:46 EST Ventricular Rate:  87 PR  Interval:  154 QRS Duration: 130 QT Interval:  382 QTC Calculation: 459 R Axis:   64 Text Interpretation: Normal sinus rhythm Non-specific intra-ventricular conduction block Marked T-wave abnormality, consider inferolateral ischemia Abnormal ECG Confirmed by Milton Ferguson [phone]) on 06/20/2020 8:22:25 PM   Radiology DG Chest 2 View  Result Date: 06/20/2020 CLINICAL DATA:  Chest pain EXAM: CHEST - 2 VIEW COMPARISON:  10/20/2005 FINDINGS: The heart size and mediastinal contours are within normal limits. Both lungs are clear. Scoliosis of the spine. Aortic atherosclerosis. IMPRESSION: No active cardiopulmonary disease. Electronically Signed   By: Donavan Foil M.D.   On: 06/20/2020 18:14    Procedures Procedures   Medications Ordered in ED Medications  nitroGLYCERIN (NITROSTAT) SL tablet 0.4 mg (0.4 mg Sublingual Given 06/20/20 2059)  hydrALAZINE (APRESOLINE) injection 10 mg (has no administration in time range)  aspirin chewable tablet 324 mg (324 mg Oral Given 06/20/20 2037)    ED Course  I have reviewed the triage vital signs and the nursing notes.  Pertinent labs & imaging results that were available during my care of the patient were reviewed by me and considered in my medical decision making (see chart for details).    MDM Rules/Calculators/A&P HEAR Score: 6                         Pt with heart score of 6, given nitroglycerin which did resolve her chest pain.  Concern for unstable angina as source of sx.  Pt's bp was initially elevated, transiently improved with ntg given, but has started to climb again.  She will need admission for cardiac rule out given risk factors, delta change in troponins of 14 and second troponin elevated at 24.  Discussed with Dr. Josephine Cables who accepts pt for admission.  Added hydralazine for bp improvement.    Final Clinical Impression(s) / ED Diagnoses Final diagnoses:  Chest pain, unspecified type  Diarrhea, unspecified type    Rx / DC Orders ED Discharge Orders    None       Landis Martins 06/20/20 2320    Milton Ferguson, MD 06/21/20 1102

## 2020-06-20 NOTE — ED Notes (Signed)
NP File notified of pts elevated BP

## 2020-06-20 NOTE — ED Triage Notes (Signed)
Chest pain x 2 hours

## 2020-06-20 NOTE — ED Notes (Signed)
Pt hooked up to monitor.

## 2020-06-20 NOTE — ED Provider Notes (Incomplete)
[facility] Provider Note   CSN: [id number] Arrival date & time: 06/20/20  1642     History Chief Complaint  Patient presents with  . Chest Pain    [name] is a 80 y.o. female.   Chest Pain      Past Medical History:  Diagnosis Date  . Asthma   . Diabetes ([place])   . HTN (hypertension)   . Hyperlipemia     Patient Active Problem List   Diagnosis Date Noted  . History of colonic polyps   . Adenomatous polyp of colon 04/08/2019  . HYPERLIPIDEMIA-MIXED 10/20/2008  . NECK PAIN 10/12/2008  . HEARTBURN 10/12/2008  . LEUKOCYTOSIS 12/22/2007  . HYPERGLYCEMIA 12/22/2007  . Essential hypertension 10/28/2007  . SENILE OSTEOPOROSIS 10/28/2007    Past Surgical History:  Procedure Laterality Date  . ABDOMINAL HYSTERECTOMY     PROLAPSED   . COLONOSCOPY N/A 07/05/2019   Procedure: COLONOSCOPY;  Surgeon: Danie Binder, MD;  Location: [facility];  Service: Endoscopy;  Laterality: N/A;  1:00pm  . COLONOSCOPY W/ POLYPECTOMY  11/2007   SIMPLE ADENOMAS  . OVARIAN CYST REMOVAL    . POLYPECTOMY  07/05/2019   Procedure: POLYPECTOMY;  Surgeon: Danie Binder, MD;  Location: [facility];  Service: Endoscopy;;     OB History   No obstetric history on file.     Family History  Problem Relation Age of Onset  . Colon cancer Mother        AGE > 18  . Colon polyps Neg Hx     Social History   Tobacco Use  . Smoking status: Former Research scientist (life sciences)  . Smokeless tobacco: Never Used  Vaping Use  . Vaping Use: Never used  Substance Use Topics  . Alcohol use: Not Currently  . Drug use: Not Currently    Home Medications Prior to Admission medications   Medication Sig Start Date End Date Taking? Authorizing Provider  albuterol (VENTOLIN HFA) 108 (90 Base) MCG/ACT inhaler Inhale 1-2 puffs into the lungs every 6 (six) hours as needed for wheezing or shortness of breath.  03/17/19  Yes [provider]  Cyanocobalamin (VITAMIN B-12 PO) Take 1 tablet by  mouth 3 (three) times a week.   Yes [provider]  metFORMIN (GLUCOPHAGE) 500 MG tablet Take 500 mg by mouth daily. 03/16/19  Yes [provider]  metoprolol succinate (TOPROL-XL) 25 MG 24 hr tablet Take 25 mg by mouth daily. 03/17/19  Yes [provider]  simvastatin (ZOCOR) 10 MG tablet Take 10 mg by mouth at bedtime. 03/16/19  Yes [provider]  SYMBICORT 160-4.5 MCG/ACT inhaler Inhale 2 puffs into the lungs 2 (two) times daily. 10/26/18  Yes [provider]  montelukast (SINGULAIR) 10 MG tablet  02/09/20   [provider]  Omega-3 Fatty Acids (OMEGA-3 FISH OIL PO) Take 1 capsule by mouth daily.  Patient not taking: No sig reported    [provider]    Allergies    Patient has no known allergies.  Review of Systems   Review of Systems  Cardiovascular: Positive for chest pain.    Physical Exam Updated Vital Signs BP (!) 186/143   Pulse 64   Temp 98.3 F (36.8 C) (Oral)   Resp 16   SpO2 100%   Physical Exam  ED Results / Procedures / Treatments   Labs (all labs ordered are listed, but only abnormal results are displayed) Labs Reviewed  BASIC METABOLIC PANEL -  Abnormal; Notable for the following components:      Result Value   Sodium 134 (*)    CO2 20 (*)    Glucose, Bld 184 (*)    GFR, Estimated 59 (*)    All other components within normal limits  CBC  TROPONIN I (HIGH SENSITIVITY)  TROPONIN I (HIGH SENSITIVITY)    EKG EKG Interpretation  Date/Time:  Tuesday June 20 2020 16:48:46 EST Ventricular Rate:  87 PR Interval:  154 QRS Duration: 130 QT Interval:  382 QTC Calculation: 459 R Axis:   64 Text Interpretation: Normal sinus rhythm Non-specific intra-ventricular conduction block Marked T-wave abnormality, consider inferolateral ischemia Abnormal ECG Confirmed by Milton Ferguson [phone]) on 06/20/2020 8:22:25 PM   Radiology DG Chest 2 View  Result Date: 06/20/2020 CLINICAL DATA:  Chest pain  EXAM: CHEST - 2 VIEW COMPARISON:  10/20/2005 FINDINGS: The heart size and mediastinal contours are within normal limits. Both lungs are clear. Scoliosis of the spine. Aortic atherosclerosis. IMPRESSION: No active cardiopulmonary disease. Electronically Signed   By: Donavan Foil M.D.   On: 06/20/2020 18:14    Procedures Procedures {Remember to document critical care time when appropriate:1}  Medications Ordered in ED Medications  aspirin chewable tablet 324 mg (has no administration in time range)  nitroGLYCERIN (NITROSTAT) SL tablet 0.4 mg (has no administration in time range)    ED Course  I have reviewed the triage vital signs and the nursing notes.  Pertinent labs & imaging results that were available during my care of the patient were reviewed by me and considered in my medical decision making (see chart for details).    MDM Rules/Calculators/A&P                          *** Final Clinical Impression(s) / ED Diagnoses Final diagnoses:  None    Rx / DC Orders ED Discharge Orders    None

## 2020-06-21 ENCOUNTER — Telehealth: Payer: Self-pay | Admitting: *Deleted

## 2020-06-21 ENCOUNTER — Observation Stay (HOSPITAL_BASED_OUTPATIENT_CLINIC_OR_DEPARTMENT_OTHER): Payer: Medicare HMO

## 2020-06-21 DIAGNOSIS — E1165 Type 2 diabetes mellitus with hyperglycemia: Secondary | ICD-10-CM | POA: Diagnosis not present

## 2020-06-21 DIAGNOSIS — I351 Nonrheumatic aortic (valve) insufficiency: Secondary | ICD-10-CM

## 2020-06-21 DIAGNOSIS — I10 Essential (primary) hypertension: Secondary | ICD-10-CM | POA: Diagnosis not present

## 2020-06-21 DIAGNOSIS — I160 Hypertensive urgency: Secondary | ICD-10-CM

## 2020-06-21 DIAGNOSIS — R079 Chest pain, unspecified: Secondary | ICD-10-CM

## 2020-06-21 DIAGNOSIS — I350 Nonrheumatic aortic (valve) stenosis: Secondary | ICD-10-CM

## 2020-06-21 DIAGNOSIS — R197 Diarrhea, unspecified: Secondary | ICD-10-CM

## 2020-06-21 DIAGNOSIS — E782 Mixed hyperlipidemia: Secondary | ICD-10-CM | POA: Diagnosis not present

## 2020-06-21 DIAGNOSIS — J45909 Unspecified asthma, uncomplicated: Secondary | ICD-10-CM

## 2020-06-21 DIAGNOSIS — E785 Hyperlipidemia, unspecified: Secondary | ICD-10-CM | POA: Diagnosis not present

## 2020-06-21 LAB — COMPREHENSIVE METABOLIC PANEL
ALT: 14 U/L (ref 0–44)
AST: 20 U/L (ref 15–41)
Albumin: 3.4 g/dL — ABNORMAL LOW (ref 3.5–5.0)
Alkaline Phosphatase: 61 U/L (ref 38–126)
Anion gap: 5 (ref 5–15)
BUN: 18 mg/dL (ref 8–23)
CO2: 23 mmol/L (ref 22–32)
Calcium: 8.5 mg/dL — ABNORMAL LOW (ref 8.9–10.3)
Chloride: 105 mmol/L (ref 98–111)
Creatinine, Ser: 1.03 mg/dL — ABNORMAL HIGH (ref 0.44–1.00)
GFR, Estimated: 55 mL/min — ABNORMAL LOW (ref >60)
Glucose, Bld: 216 mg/dL — ABNORMAL HIGH (ref 70–99)
Potassium: 4.1 mmol/L (ref 3.5–5.1)
Sodium: 133 mmol/L — ABNORMAL LOW (ref 135–145)
Total Bilirubin: 0.4 mg/dL (ref 0.3–1.2)
Total Protein: 5.9 g/dL — ABNORMAL LOW (ref 6.5–8.1)

## 2020-06-21 LAB — CBC
HCT: 34.4 % — ABNORMAL LOW (ref 36.0–46.0)
Hemoglobin: 11.4 g/dL — ABNORMAL LOW (ref 12.0–15.0)
MCH: 31 pg (ref 26.0–34.0)
MCHC: 33.1 g/dL (ref 30.0–36.0)
MCV: 93.5 fL (ref 80.0–100.0)
Platelets: 318 10*3/uL (ref 150–400)
RBC: 3.68 MIL/uL — ABNORMAL LOW (ref 3.87–5.11)
RDW: 12.3 % (ref 11.5–15.5)
WBC: 8.1 10*3/uL (ref 4.0–10.5)
nRBC: 0 % (ref 0.0–0.2)

## 2020-06-21 LAB — ECHOCARDIOGRAM COMPLETE
AR max vel: 0.99 cm2
AV Area VTI: 1.07 cm2
AV Area mean vel: 0.87 cm2
AV Mean grad: 10.3 mmHg
AV Peak grad: 16.5 mmHg
Ao pk vel: 2.03 m/s
Area-P 1/2: 3.53 cm2
Height: 59 in
P 1/2 time: 502 msec
S' Lateral: 2.71 cm
Weight: 1781.32 oz

## 2020-06-21 LAB — TROPONIN I (HIGH SENSITIVITY)
Troponin I (High Sensitivity): 36 ng/L — ABNORMAL HIGH (ref <18)
Troponin I (High Sensitivity): 33 ng/L — ABNORMAL HIGH (ref <18)

## 2020-06-21 LAB — PROTIME-INR
INR: 1.2 (ref 0.8–1.2)
Prothrombin Time: 14.6 seconds (ref 11.4–15.2)

## 2020-06-21 LAB — GLUCOSE, CAPILLARY
Glucose-Capillary: 124 mg/dL — ABNORMAL HIGH (ref 70–99)
Glucose-Capillary: 119 mg/dL — ABNORMAL HIGH (ref 70–99)
Glucose-Capillary: 259 mg/dL — ABNORMAL HIGH (ref 70–99)

## 2020-06-21 LAB — PHOSPHORUS: Phosphorus: 3.1 mg/dL (ref 2.5–4.6)

## 2020-06-21 LAB — APTT: aPTT: 28 seconds (ref 24–36)

## 2020-06-21 LAB — MAGNESIUM: Magnesium: 1.8 mg/dL (ref 1.7–2.4)

## 2020-06-21 MED ORDER — INSULIN ASPART 100 UNIT/ML ~~LOC~~ SOLN: 0.0000 [IU] | Freq: Every day | SUBCUTANEOUS | Status: DC

## 2020-06-21 MED ORDER — NITROGLYCERIN 0.3 MG SL SUBL: 0.3000 mg | SUBLINGUAL_TABLET | SUBLINGUAL | 1 refills | Status: DC | PRN

## 2020-06-21 MED ORDER — SIMVASTATIN 20 MG PO TABS: 10.0000 mg | ORAL_TABLET | Freq: Every day | ORAL | Status: DC

## 2020-06-21 MED ORDER — ENOXAPARIN SODIUM 40 MG/0.4ML ~~LOC~~ SOLN: 40.0000 mg | SUBCUTANEOUS | Status: DC

## 2020-06-21 MED ORDER — METOPROLOL SUCCINATE ER 25 MG PO TB24
25.0000 mg | ORAL_TABLET | Freq: Every day | ORAL | Status: DC
  Administered 2020-06-21: 25 mg via ORAL
  Filled 2020-06-21: qty 1

## 2020-06-21 MED ORDER — NITROGLYCERIN 0.4 MG SL SUBL: 0.4000 mg | SUBLINGUAL_TABLET | SUBLINGUAL | Status: DC | PRN

## 2020-06-21 MED ORDER — ASPIRIN EC 81 MG PO TBEC
81.0000 mg | DELAYED_RELEASE_TABLET | Freq: Every day | ORAL | Status: DC
  Administered 2020-06-21: 81 mg via ORAL
  Filled 2020-06-21: qty 1

## 2020-06-21 MED ORDER — INSULIN ASPART 100 UNIT/ML ~~LOC~~ SOLN
0.0000 [IU] | Freq: Three times a day (TID) | SUBCUTANEOUS | Status: DC
  Administered 2020-06-21: 8 [IU] via SUBCUTANEOUS

## 2020-06-21 MED ORDER — ASPIRIN 81 MG PO TBEC: 81.0000 mg | DELAYED_RELEASE_TABLET | Freq: Every day | ORAL | 11 refills | Status: DC

## 2020-06-21 MED ORDER — MOMETASONE FURO-FORMOTEROL FUM 200-5 MCG/ACT IN AERO
2.0000 | INHALATION_SPRAY | Freq: Two times a day (BID) | RESPIRATORY_TRACT | Status: DC
  Administered 2020-06-21: 2 via RESPIRATORY_TRACT
  Filled 2020-06-21: qty 8.8

## 2020-06-21 MED ORDER — ALBUTEROL SULFATE HFA 108 (90 BASE) MCG/ACT IN AERS: 1.0000 | INHALATION_SPRAY | Freq: Four times a day (QID) | RESPIRATORY_TRACT | Status: DC | PRN

## 2020-06-21 NOTE — Telephone Encounter (Signed)
Order placed

## 2020-06-21 NOTE — Telephone Encounter (Signed)
-----   Message from Erma Heritage, [place] sent at 06/21/2020 11:19 AM EST ----- Regarding: Outpatient Stress Test This patient will need an outpatient Lexiscan Myoview for chest pain per Dr. Johnsie Cancel. She is being discharged this morning and is aware someone will call to arrange her stress test. I did schedule a follow-up visit on 3/7 so if her stress test is going to be after that, would push her visit out.   Thanks for your help!  [place],  [place]

## 2020-06-21 NOTE — Discharge Summary (Signed)
Physician Discharge Summary  [name] KKX:[id number] DOB: 01/04/1941 DOA: 06/20/2020  PCP: [name], PA-C  Admit date: 06/20/2020 Discharge date: 06/21/2020  Admitted From:  Home  Disposition: Home   Recommendations for Outpatient Follow-up:  1. Follow up with PCP in 1 weeks 2. Follow up with cardiology in 2 weeks for outpatient lexiscan myoview study consideration  Discharge Condition: STABLE   CODE STATUS: FULL   DIET: heart healthy recommended  Brief Hospitalization Summary: Please see all hospital notes, images, labs for full details of the hospitalization. ADMISSION HPI: [name] is a 80 y.o. female with medical history significant for hypertension, hyperlipidemia, T2DM and asthma who presents to the emergency department due to 1 week history of intermittent left-sided chest pressure with radiation to left scapula area which occurs both at rest and in ambulation.  Symptoms recur more frequently during the week and was worse yesterday (2/15) while at rest.  She states that she took BP and it was 169 systolic.  She states that she has been compliant with her BP meds. She endorsed 2-day onset of loose bowel movement with 4 episodes of nonbloody watery stools within this time period.  Denies fever, chills, shortness of breath, cough, headache or vision changes.  ED Course: In the emergency department, BP was elevated at 199/70, but other vital signs were within normal range.  Work-up in the ED showed normal CBC, and hyperglycemia.  Troponin x2- 10 > 4.  SARS coronavirus was negative. Chest x-ray showed no active cardiopulmonary disease.  Aspirin 325 Mg x1 was given, IV hydration was provided and nitroglycerin was given with improved BP and resolved chest pain.  Hospitalist was asked to admit patient for further evaluation and management.  Hospital Course Pt was admitted for atypical chest pain.  She had normal serial HS troponin tests.  She was seen by cardiology inpatient and a  2D echo was ordered.  It did not show any signs that were concerning for ischemia.  No RWMA and normal EF.  Pt is stable to discharge home with outpatient follow up with cardiology for consideration of outpatient lexiscan myoview. Pt will discharge on aspirin, toprol, simvastatin.  Close outpatient follow up recommended.   Pt is stable to discharge home today.    2D Echocardiogram  06/21/20 Left ventricular ejection fraction, by estimation, is 60 to 65%. The left ventricle has normal function. The left ventricle has no regional wall motion abnormalities. There is mild left ventricular hypertrophy of the basal and septal segments. Left ventricular diastolic parameters were normal.  2. Right ventricular systolic function is normal. The right ventricular size is normal.  3. The mitral valve is degenerative. Trivial mitral valve regurgitation. No evidence of mitral stenosis. Moderate mitral annular calcification.  4. The aortic valve is tricuspid. There is moderate calcification of the aortic valve. There is moderate thickening of the aortic valve. Aortic valve regurgitation is mild. Mild aortic valve stenosis.   Discharge Diagnoses:  Principal Problem:   Chest pain Active Problems:   Hyperlipidemia   Essential hypertension   Hypertensive urgency   Diarrhea   Hyperglycemia due to diabetes mellitus ([place])   Asthma   Discharge Instructions:  Allergies as of 06/21/2020   No Known Allergies     Medication List    STOP taking these medications   montelukast 10 MG tablet Commonly known as: SINGULAIR   OMEGA-3 FISH OIL PO     TAKE these medications   albuterol 108 (90 Base) MCG/ACT inhaler Commonly  known as: VENTOLIN HFA Inhale 1-2 puffs into the lungs every 6 (six) hours as needed for wheezing or shortness of breath.   aspirin 81 MG EC tablet Take 1 tablet (81 mg total) by mouth daily. Swallow whole. Start taking on: June 22, 2020   metFORMIN 500 MG tablet Commonly known as:  GLUCOPHAGE Take 500 mg by mouth daily.   metoprolol succinate 25 MG 24 hr tablet Commonly known as: TOPROL-XL Take 25 mg by mouth daily.   nitroGLYCERIN 0.3 MG SL tablet Commonly known as: NITROSTAT Place 1 tablet (0.3 mg total) under the tongue every 5 (five) minutes as needed for up to 3 doses for chest pain.   simvastatin 10 MG tablet Commonly known as: ZOCOR Take 10 mg by mouth at bedtime.   Symbicort 160-4.5 MCG/ACT inhaler Generic drug: budesonide-formoterol Inhale 2 puffs into the lungs 2 (two) times daily.   VITAMIN B-12 PO Take 1 tablet by mouth 3 (three) times a week.       Follow-up Information    [name], PA-C. Schedule an appointment as soon as possible for a visit in 1 week(s).   Specialty: Family Medicine Why: Hospital Follow Up  Contact information: [place] [address] [phone]        Josue Hector, MD. Schedule an appointment as soon as possible for a visit in 2 week(s).   Specialty: Cardiology Contact information: [address] [place] [address] [phone]              No Known Allergies Allergies as of 06/21/2020   No Known Allergies     Medication List    STOP taking these medications   montelukast 10 MG tablet Commonly known as: SINGULAIR   OMEGA-3 FISH OIL PO     TAKE these medications   albuterol 108 (90 Base) MCG/ACT inhaler Commonly known as: VENTOLIN HFA Inhale 1-2 puffs into the lungs every 6 (six) hours as needed for wheezing or shortness of breath.   aspirin 81 MG EC tablet Take 1 tablet (81 mg total) by mouth daily. Swallow whole. Start taking on: June 22, 2020   metFORMIN 500 MG tablet Commonly known as: GLUCOPHAGE Take 500 mg by mouth daily.   metoprolol succinate 25 MG 24 hr tablet Commonly known as: TOPROL-XL Take 25 mg by mouth daily.   nitroGLYCERIN 0.3 MG SL tablet Commonly known as: NITROSTAT Place 1 tablet (0.3 mg total) under the tongue every 5  (five) minutes as needed for up to 3 doses for chest pain.   simvastatin 10 MG tablet Commonly known as: ZOCOR Take 10 mg by mouth at bedtime.   Symbicort 160-4.5 MCG/ACT inhaler Generic drug: budesonide-formoterol Inhale 2 puffs into the lungs 2 (two) times daily.   VITAMIN B-12 PO Take 1 tablet by mouth 3 (three) times a week.       Procedures/Studies: DG Chest 2 View  Result Date: 06/20/2020 CLINICAL DATA:  Chest pain EXAM: CHEST - 2 VIEW COMPARISON:  10/20/2005 FINDINGS: The heart size and mediastinal contours are within normal limits. Both lungs are clear. Scoliosis of the spine. Aortic atherosclerosis. IMPRESSION: No active cardiopulmonary disease. Electronically Signed   By: Donavan Foil M.D.   On: 06/20/2020 18:14   ECHOCARDIOGRAM COMPLETE  Result Date: 06/21/2020    ECHOCARDIOGRAM REPORT   Patient Name:   [name] Date of Exam: 06/21/2020 Medical Rec #:  [id number]   Height:       59.0  in Accession #:    [id number]  Weight:       111.3 lb Date of Birth:  Jul 28, 1940    BSA:          1.438 m Patient Age:    37 years    BP:           127/56 mmHg Patient Gender: F           HR:           77 bpm. Exam Location:  [facility] Procedure: 2D Echo Indications:    Chest Pain R07.9  History:        Patient has no prior history of Echocardiogram examinations.                 Signs/Symptoms:Chest Pain; Risk Factors:Dyslipidemia,                 Hypertension, Former Smoker and Diabetes.  Sonographer:    Leavy Cella RDCS (AE) Referring Phys: [id number] [place]  1. Left ventricular ejection fraction, by estimation, is 60 to 65%. The left ventricle has normal function. The left ventricle has no regional wall motion abnormalities. There is mild left ventricular hypertrophy of the basal and septal segments. Left ventricular diastolic parameters were normal.  2. Right ventricular systolic function is normal. The right ventricular size is normal.  3. The mitral valve is  degenerative. Trivial mitral valve regurgitation. No evidence of mitral stenosis. Moderate mitral annular calcification.  4. The aortic valve is tricuspid. There is moderate calcification of the aortic valve. There is moderate thickening of the aortic valve. Aortic valve regurgitation is mild. Mild aortic valve stenosis.  5. The inferior vena cava is normal in size with greater than 50% respiratory variability, suggesting right atrial pressure of 3 mmHg. FINDINGS  Left Ventricle: Left ventricular ejection fraction, by estimation, is 60 to 65%. The left ventricle has normal function. The left ventricle has no regional wall motion abnormalities. The left ventricular internal cavity size was normal in size. There is  mild left ventricular hypertrophy of the basal and septal segments. Left ventricular diastolic parameters were normal. Right Ventricle: The right ventricular size is normal. No increase in right ventricular wall thickness. Right ventricular systolic function is normal. Left Atrium: Left atrial size was normal in size. Right Atrium: Right atrial size was normal in size. Pericardium: There is no evidence of pericardial effusion. Mitral Valve: The mitral valve is degenerative in appearance. There is moderate thickening of the mitral valve leaflet(s). There is moderate calcification of the mitral valve leaflet(s). Moderate mitral annular calcification. Trivial mitral valve regurgitation. No evidence of mitral valve stenosis. Tricuspid Valve: The tricuspid valve is normal in structure. Tricuspid valve regurgitation is mild . No evidence of tricuspid stenosis. Aortic Valve: The aortic valve is tricuspid. There is moderate calcification of the aortic valve. There is moderate thickening of the aortic valve. Aortic valve regurgitation is mild. Aortic regurgitation PHT measures 502 msec. Mild aortic stenosis is present. Aortic valve mean gradient measures 10.3 mmHg. Aortic valve peak gradient measures 16.5 mmHg.  Aortic valve area, by VTI measures 1.07 cm. Pulmonic Valve: The pulmonic valve was normal in structure. Pulmonic valve regurgitation is not visualized. No evidence of pulmonic stenosis. Aorta: The aortic root is normal in size and structure. Venous: The inferior vena cava is normal in size with greater than 50% respiratory variability, suggesting right atrial pressure of 3 mmHg. IAS/Shunts: No atrial level shunt detected by color flow Doppler.  LEFT VENTRICLE PLAX 2D LVIDd:         3.69 cm  Diastology LVIDs:         2.71 cm  LV e' medial:    5.77 cm/s LV PW:         1.31 cm  LV E/e' medial:  19.2 LV IVS:        1.13 cm  LV e' lateral:   6.42 cm/s LVOT diam:     1.70 cm  LV E/e' lateral: 17.3 LV SV:         46 LV SV Index:   32 LVOT Area:     2.27 cm  RIGHT VENTRICLE RV S prime:     15.10 cm/s TAPSE (M-mode): 2.2 cm LEFT ATRIUM             Index       RIGHT ATRIUM          Index LA diam:        3.40 cm 2.36 cm/m  RA Area:     6.17 cm LA Vol (A2C):   42.1 ml 29.28 ml/m RA Volume:   8.58 ml  5.97 ml/m LA Vol (A4C):   48.6 ml 33.80 ml/m LA Biplane Vol: 47.6 ml 33.10 ml/m  AORTIC VALVE AV Area (Vmax):    0.99 cm AV Area (Vmean):   0.87 cm AV Area (VTI):     1.07 cm AV Vmax:           203.33 cm/s AV Vmean:          150.667 cm/s AV VTI:            0.428 m AV Peak Grad:      16.5 mmHg AV Mean Grad:      10.3 mmHg LVOT Vmax:         88.57 cm/s LVOT Vmean:        57.633 cm/s LVOT VTI:          0.202 m LVOT/AV VTI ratio: 0.47 AI PHT:            502 msec  AORTA Ao Root diam: 2.60 cm MITRAL VALVE                TRICUSPID VALVE MV Area (PHT): 3.53 cm     TR Peak grad:   34.3 mmHg MV Decel Time: 215 msec     TR Vmax:        293.00 cm/s MV E velocity: 111.00 cm/s MV A velocity: 140.00 cm/s  SHUNTS MV E/A ratio:  0.79         Systemic VTI:  0.20 m                             Systemic Diam: 1.70 cm Jenkins Rouge MD Electronically signed by Jenkins Rouge MD Signature Date/Time: 06/21/2020/10:51:01 AM    Final        Subjective: Pt reports feeling much better, no chest pain no chest discomfort.  No SOB. Feeling well.    Discharge Exam: Vitals:   06/21/20 0226 06/21/20 0521  BP: (!) 104/49 (!) 127/56  Pulse: 65 77  Resp: 20 18  Temp: 98.1 F (36.7 C) 98.6 F (37 C)  SpO2: 98% 99%   Vitals:   06/21/20 0047 06/21/20 0105 06/21/20 0226 06/21/20 0521  BP:  (!) 141/68 (!) 104/49 (!) 127/56  Pulse:  85 65 77  Resp:  16 20 18  Temp: 97.9 F (36.6 C) 98.2 F (36.8 C) 98.1 F (36.7 C) 98.6 F (37 C)  TempSrc: Oral Oral Oral Oral  SpO2:  100% 98% 99%  Weight:  50.5 kg    Height:  4\' 11"  (1.499 m)      General: Pt is frail, elderly but alert, awake, not in acute distress Cardiovascular: normal S1/S2 +, no rubs, no gallops Respiratory: CTA bilaterally, no wheezing, no rhonchi Abdominal: Soft, NT, ND, bowel sounds + Extremities: no edema, no cyanosis   The results of significant diagnostics from this hospitalization (including imaging, microbiology, ancillary and laboratory) are listed below for reference.     Microbiology: Recent Results (from the past 240 hour(s))  Resp Panel by RT-PCR (Flu A&B, Covid) Nasopharyngeal Swab     Status: None   Collection Time: 06/20/20 10:05 PM   Specimen: Nasopharyngeal Swab; Nasopharyngeal(NP) swabs in vial transport medium  Result Value Ref Range Status   SARS Coronavirus 2 by RT PCR NEGATIVE NEGATIVE Final    Comment: (NOTE) SARS-CoV-2 target nucleic acids are NOT DETECTED.  The SARS-CoV-2 RNA is generally detectable in upper respiratory specimens during the acute phase of infection. The lowest concentration of SARS-CoV-2 viral copies this assay can detect is 138 copies/mL. A negative result does not preclude SARS-Cov-2 infection and should not be used as the sole basis for treatment or other patient management decisions. A negative result may occur with  improper specimen collection/handling, submission of specimen other than nasopharyngeal swab,  presence of viral mutation(s) within the areas targeted by this assay, and inadequate number of viral copies(<138 copies/mL). A negative result must be combined with clinical observations, patient history, and epidemiological information. The expected result is Negative.  Fact Sheet for Patients:  EntrepreneurPulse.com.au  Fact Sheet for Healthcare Providers:  IncredibleEmployment.be  This test is no t yet approved or cleared by the [place] FDA and  has been authorized for detection and/or diagnosis of SARS-CoV-2 by FDA under an Emergency Use Authorization (EUA). This EUA will remain  in effect (meaning this test can be used) for the duration of the COVID-19 declaration under Section 564(b)(1) of the Act, 21 U.S.C.section 360bbb-3(b)(1), unless the authorization is terminated  or revoked sooner.       Influenza A by PCR NEGATIVE NEGATIVE Final   Influenza B by PCR NEGATIVE NEGATIVE Final    Comment: (NOTE) The Xpert Xpress SARS-CoV-2/FLU/RSV plus assay is intended as an aid in the diagnosis of influenza from Nasopharyngeal swab specimens and should not be used as a sole basis for treatment. Nasal washings and aspirates are unacceptable for Xpert Xpress SARS-CoV-2/FLU/RSV testing.  Fact Sheet for Patients: EntrepreneurPulse.com.au  Fact Sheet for Healthcare Providers: IncredibleEmployment.be  This test is not yet approved or cleared by the [place] FDA and has been authorized for detection and/or diagnosis of SARS-CoV-2 by FDA under an Emergency Use Authorization (EUA). This EUA will remain in effect (meaning this test can be used) for the duration of the COVID-19 declaration under Section 564(b)(1) of the Act, 21 U.S.C. section 360bbb-3(b)(1), unless the authorization is terminated or revoked.  Performed at [facility], [address] [place], [place] [address]      Labs: BNP (last 3  results) No results for input(s): BNP in the last 8760 hours. Basic Metabolic Panel: Recent Labs  Lab 06/20/20 1728  NA 134*  K 4.4  CL 105  CO2 20*  GLUCOSE 184*  BUN 19  CREATININE 0.98  CALCIUM 9.3   Liver Function  Tests: No results for input(s): AST, ALT, ALKPHOS, BILITOT, PROT, ALBUMIN in the last 168 hours. No results for input(s): LIPASE, AMYLASE in the last 168 hours. No results for input(s): AMMONIA in the last 168 hours. CBC: Recent Labs  Lab 06/20/20 1728  WBC 7.4  HGB 12.6  HCT 39.0  MCV 92.4  PLT 368   Cardiac Enzymes: No results for input(s): CKTOTAL, CKMB, CKMBINDEX, TROPONINI in the last 168 hours. BNP: Invalid input(s): POCBNP CBG: Recent Labs  Lab 06/21/20 0124 06/21/20 0749  GLUCAP 124* 119*   D-Dimer No results for input(s): DDIMER in the last 72 hours. Hgb A1c No results for input(s): HGBA1C in the last 72 hours. Lipid Profile No results for input(s): CHOL, HDL, LDLCALC, TRIG, CHOLHDL, LDLDIRECT in the last 72 hours. Thyroid function studies No results for input(s): TSH, T4TOTAL, T3FREE, THYROIDAB in the last 72 hours.  Invalid input(s): FREET3 Anemia work up No results for input(s): VITAMINB12, FOLATE, FERRITIN, TIBC, IRON, RETICCTPCT in the last 72 hours. Urinalysis No results found for: COLORURINE, APPEARANCEUR, [place], [place], GLUCOSEU, [place], [place], [place], PROTEINUR, UROBILINOGEN, NITRITE, LEUKOCYTESUR Sepsis Labs Invalid input(s): PROCALCITONIN,  WBC,  LACTICIDVEN Microbiology Recent Results (from the past 240 hour(s))  Resp Panel by RT-PCR (Flu A&B, Covid) Nasopharyngeal Swab     Status: None   Collection Time: 06/20/20 10:05 PM   Specimen: Nasopharyngeal Swab; Nasopharyngeal(NP) swabs in vial transport medium  Result Value Ref Range Status   SARS Coronavirus 2 by RT PCR NEGATIVE NEGATIVE Final    Comment: (NOTE) SARS-CoV-2 target nucleic acids are NOT DETECTED.  The SARS-CoV-2 RNA is generally detectable in  upper respiratory specimens during the acute phase of infection. The lowest concentration of SARS-CoV-2 viral copies this assay can detect is 138 copies/mL. A negative result does not preclude SARS-Cov-2 infection and should not be used as the sole basis for treatment or other patient management decisions. A negative result may occur with  improper specimen collection/handling, submission of specimen other than nasopharyngeal swab, presence of viral mutation(s) within the areas targeted by this assay, and inadequate number of viral copies(<138 copies/mL). A negative result must be combined with clinical observations, patient history, and epidemiological information. The expected result is Negative.  Fact Sheet for Patients:  EntrepreneurPulse.com.au  Fact Sheet for Healthcare Providers:  IncredibleEmployment.be  This test is no t yet approved or cleared by the [place] FDA and  has been authorized for detection and/or diagnosis of SARS-CoV-2 by FDA under an Emergency Use Authorization (EUA). This EUA will remain  in effect (meaning this test can be used) for the duration of the COVID-19 declaration under Section 564(b)(1) of the Act, 21 U.S.C.section 360bbb-3(b)(1), unless the authorization is terminated  or revoked sooner.       Influenza A by PCR NEGATIVE NEGATIVE Final   Influenza B by PCR NEGATIVE NEGATIVE Final    Comment: (NOTE) The Xpert Xpress SARS-CoV-2/FLU/RSV plus assay is intended as an aid in the diagnosis of influenza from Nasopharyngeal swab specimens and should not be used as a sole basis for treatment. Nasal washings and aspirates are unacceptable for Xpert Xpress SARS-CoV-2/FLU/RSV testing.  Fact Sheet for Patients: EntrepreneurPulse.com.au  Fact Sheet for Healthcare Providers: IncredibleEmployment.be  This test is not yet approved or cleared by the [place] FDA and has been  authorized for detection and/or diagnosis of SARS-CoV-2 by FDA under an Emergency Use Authorization (EUA). This EUA will remain in effect (meaning this test can be used) for the duration of the COVID-19  declaration under Section 564(b)(1) of the Act, 21 U.S.C. section 360bbb-3(b)(1), unless the authorization is terminated or revoked.  Performed at [facility], [address], [place], [place] [address]     Time coordinating discharge:   SIGNED:  Irwin Brakeman, MD  [facility] 06/21/2020, 11:06 AM How to contact the [facility] Attending or Consulting provider [place] or covering provider during after hours [place], for this patient?  1. Check the care team in [facility] and look for a) attending/consulting [facility] provider listed and b) the [facility] team listed 2. Log into [email or web address] and use [place]'s universal password to access. If you do not have the password, please contact the hospital operator. 3. Locate the [facility] provider you are looking for under [facility] and page to a number that you can be directly reached. 4. If you still have difficulty reaching the provider, please page the [facility] (Director on Call) for the Hospitalists listed on amion for assistance.

## 2020-06-21 NOTE — Discharge Instructions (Signed)
Nonspecific Chest Pain Chest pain can be caused by many different conditions. Some causes of chest pain can be life-threatening. These will require treatment right away. Serious causes of chest pain include:  Heart attack.  A tear in the body's main blood vessel.  Redness and swelling (inflammation) around your heart.  Blood clot in your lungs. Other causes of chest pain may not be so serious. These include:  Heartburn.  Anxiety or stress.  Damage to bones or muscles in your chest.  Lung infections. Chest pain can feel like:  Pain or discomfort in your chest.  Crushing, pressure, aching, or squeezing pain.  Burning or tingling.  Dull or sharp pain that is worse when you move, cough, or take a deep breath.  Pain or discomfort that is also felt in your back, neck, jaw, shoulder, or arm, or pain that spreads to any of these areas. It is hard to know whether your pain is caused by something that is serious or something that is not so serious. So it is important to see your doctor right away if you have chest pain. Follow these instructions at home: Medicines  Take over-the-counter and prescription medicines only as told by your doctor.  If you were prescribed an antibiotic medicine, take it as told by your doctor. Do not stop taking the antibiotic even if you start to feel better. Lifestyle  Rest as told by your doctor.  Do not use any products that contain nicotine or tobacco, such as cigarettes, e-cigarettes, and chewing tobacco. If you need help quitting, ask your doctor.  Do not drink alcohol.  Make lifestyle changes as told by your doctor. These may include: ? Getting regular exercise. Ask your doctor what activities are safe for you. ? Eating a heart-healthy diet. A diet and nutrition specialist (dietitian) can help you to learn healthy eating options. ? Staying at a healthy weight. ? Treating diabetes or high blood pressure, if needed. ? Lowering your stress.  Activities such as yoga and relaxation techniques can help.   General instructions  Pay attention to any changes in your symptoms. Tell your doctor about them or any new symptoms.  Avoid any activities that cause chest pain.  Keep all follow-up visits as told by your doctor. This is important. You may need more testing if your chest pain does not go away. Contact a doctor if:  Your chest pain does not go away.  You feel depressed.  You have a fever. Get help right away if:  Your chest pain is worse.  You have a cough that gets worse, or you cough up blood.  You have very bad (severe) pain in your belly (abdomen).  You pass out (faint).  You have either of these for no clear reason: ? Sudden chest discomfort. ? Sudden discomfort in your arms, back, neck, or jaw.  You have shortness of breath at any time.  You suddenly start to sweat, or your skin gets clammy.  You feel sick to your stomach (nauseous).  You throw up (vomit).  You suddenly feel lightheaded or dizzy.  You feel very weak or tired.  Your heart starts to beat fast, or it feels like it is skipping beats. These symptoms may be an emergency. Do not wait to see if the symptoms will go away. Get medical help right away. Call your local emergency services (911 in the [place] Do not drive yourself to the hospital. Summary  Chest pain can be caused by many different conditions.  The cause may be serious and need treatment right away. If you have chest pain, see your doctor right away.  Follow your doctor's instructions for taking medicines and making lifestyle changes.  Keep all follow-up visits as told by your doctor. This includes visits for any further testing if your chest pain does not go away.  Be sure to know the signs that show that your condition has become worse. Get help right away if you have these symptoms. This information is not intended to replace advice given to you by your health care provider.  Make sure you discuss any questions you have with your health care provider. Document Revised: 10/23/2017 Document Reviewed: 10/23/2017 Elsevier Patient Education  2021 [place].   IMPORTANT INFORMATION: PAY CLOSE ATTENTION   PHYSICIAN DISCHARGE INSTRUCTIONS  Follow with Primary care provider  [name], PA-C  and other consultants as instructed by your Hospitalist Physician  [place] IF SYMPTOMS COME BACK, WORSEN OR NEW PROBLEM DEVELOPS   Please note: You were cared for by a hospitalist during your hospital stay. Every effort will be made to forward records to your primary care provider.  You can request that your primary care provider send for your hospital records if they have not received them.  Once you are discharged, your primary care physician will handle any further medical issues. Please note that NO REFILLS for any discharge medications will be authorized once you are discharged, as it is imperative that you return to your primary care physician (or establish a relationship with a primary care physician if you do not have one) for your post hospital discharge needs so that they can reassess your need for medications and monitor your lab values.  Please get a complete blood count and chemistry panel checked by your Primary MD at your next visit, and again as instructed by your Primary MD.  Get Medicines reviewed and adjusted: Please take all your medications with you for your next visit with your Primary MD  Laboratory/radiological data: Please request your Primary MD to go over all hospital tests and procedure/radiological results at the follow up, please ask your primary care provider to get all Hospital records sent to his/her office.  In some cases, they will be blood work, cultures and biopsy results pending at the time of your discharge. Please request that your primary care provider follow up on these results.  If you are  diabetic, please bring your blood sugar readings with you to your follow up appointment with primary care.    Please call and make your follow up appointments as soon as possible.    Also Note the following: If you experience worsening of your admission symptoms, develop shortness of breath, life threatening emergency, suicidal or homicidal thoughts you must seek medical attention immediately by calling 911 or calling your MD immediately  if symptoms less severe.  You must read complete instructions/literature along with all the possible adverse reactions/side effects for all the Medicines you take and that have been prescribed to you. Take any new Medicines after you have completely understood and accpet all the possible adverse reactions/side effects.   Do not drive when taking Pain medications or sleeping medications (Benzodiazepines)  Do not take more than prescribed Pain, Sleep and Anxiety Medications. It is not advisable to combine anxiety,sleep and pain medications without talking with your primary care practitioner  Special Instructions: If you have smoked or chewed Tobacco  in the last 2 yrs  please stop smoking, stop any regular Alcohol  and or any Recreational drug use.  Wear Seat belts while driving.  Do not drive if taking any narcotic, mind altering or controlled substances or recreational drugs or alcohol.

## 2020-06-21 NOTE — Consult Note (Addendum)
Cardiology Consultation:   Patient ID: [name] MRN: [id number]; DOB: Jan 21, 1941  Admit date: 06/20/2020 Date of Consult: 06/21/2020  PCP:  Torres, Tiffany, [place]  Cardiologist: New this admission -->  Tiffany Rouge, MD   Patient Profile:   [name] is a 80 y.o. female with past medical history of HTN, HLD and Type 2 DM who is being seen today for the evaluation of chest pain at the request of Dr. Josephine Torres.  History of Present Illness:   [name] presented to [facility] ED on 06/20/2020 for evaluation of episodes of chest pain for the past week which could occur at rest or with activity and last for several minutes at a time. Denies any associated dyspnea, nausea, vomiting or diaphoresis. Also reported diarrhea for the past several days but denied any associated melena or hematochezia. No reported fever or chills.   BP was initially elevated to 186/104 while in the ED. Initial labs showed WBC 7.4, Hgb 12.6, platelets 368, Na+ 134, K+ 4.4 and creatinine 0.98. COVID negative. Initial HS Troponin 10 with repeat values of 24, 36 and 33. CXR with no active cardiopulmonary disease. EKG shows NSR, HR 87 with LBBB.   She initially received SL NTG while in the ED which led to a significant decline in her BP, down to 104/49. Has now stabilized at 127/56 this AM. She denies any recurrent chest pain overnight and feels back to baseline at this time.     Past Medical History:  Diagnosis Date   Asthma    Diabetes ([place])    HTN (hypertension)    Hyperlipemia     Past Surgical History:  Procedure Laterality Date   ABDOMINAL HYSTERECTOMY     PROLAPSED    COLONOSCOPY N/A 07/05/2019   Procedure: COLONOSCOPY;  Surgeon: Danie Binder, MD;  [facility];  Service: Endoscopy;  Laterality: N/A;  1:00pm   COLONOSCOPY W/ POLYPECTOMY  11/2007   SIMPLE ADENOMAS   OVARIAN CYST REMOVAL     POLYPECTOMY  07/05/2019   Procedure: POLYPECTOMY;  Surgeon: Danie Binder, MD;  Location: [facility];  Service: Endoscopy;;     Home Medications:  Prior to Admission medications   Medication Sig Start Date End Date Taking? Authorizing Provider  albuterol (VENTOLIN HFA) 108 (90 Base) MCG/ACT inhaler Inhale 1-2 puffs into the lungs every 6 (six) hours as needed for wheezing or shortness of breath.  03/17/19  Yes [provider]  Cyanocobalamin (VITAMIN B-12 PO) Take 1 tablet by mouth 3 (three) times a week.   Yes [provider]  metFORMIN (GLUCOPHAGE) 500 MG tablet Take 500 mg by mouth daily. 03/16/19  Yes [provider]  metoprolol succinate (TOPROL-XL) 25 MG 24 hr tablet Take 25 mg by mouth daily. 03/17/19  Yes [provider]  simvastatin (ZOCOR) 10 MG tablet Take 10 mg by mouth at bedtime. 03/16/19  Yes [provider]  SYMBICORT 160-4.5 MCG/ACT inhaler Inhale 2 puffs into the lungs 2 (two) times daily. 10/26/18  Yes [provider]  montelukast (SINGULAIR) 10 MG tablet  02/09/20   [provider]  Omega-3 Fatty Acids (OMEGA-3 FISH OIL PO) Take 1 capsule by mouth daily.  Patient not taking: No sig reported    [provider]    Inpatient Medications: Scheduled Meds:  aspirin EC  81 mg Oral Daily   hydrALAZINE  10 mg Intravenous Once   insulin aspart  0-15  Units Subcutaneous TID WC   insulin aspart  0-5 Units Subcutaneous QHS   Continuous Infusions:   PRN Meds: nitroGLYCERIN  Allergies:   No Known Allergies  Social History:   Social History   Socioeconomic History   Marital status: Divorced    Spouse name: Not on file   Number of children: Not on file   Years of education: Not on file   Highest education level: Not on file  Occupational History   Not on file  Tobacco Use   Smoking status: Former Smoker   Smokeless tobacco: Never Used  Scientific laboratory technician Use: Never used  Substance and Sexual Activity   Alcohol use: Not Currently   Drug use: Not Currently    Sexual activity: Not on file  Other Topics Concern   Not on file  Social History Narrative   LIVES WITH DAUGHTER. DIVORCED. SPENDS FREE TIME: WORKING AROUND THE HOUSE, GARDENING.   Social Determinants of Health   Financial Resource Strain: Not on file  Food Insecurity: Not on file  Transportation Needs: Not on file  Physical Activity: Not on file  Stress: Not on file  Social Connections: Not on file  Intimate Partner Violence: Not on file    Family History:    Family History  Problem Relation Age of Onset   Colon cancer Mother        AGE > 60   Colon polyps Neg Hx      ROS:  Please see the history of present illness.   All other ROS reviewed and negative.     Physical Exam/Data:   Vitals:   06/21/20 0047 06/21/20 0105 06/21/20 0226 06/21/20 0521  BP:  (!) 141/68 (!) 104/49 (!) 127/56  Pulse:  85 65 77  Resp:  16 20 18   Temp: 97.9 F (36.6 C) 98.2 F (36.8 C) 98.1 F (36.7 C) 98.6 F (37 C)  TempSrc: Oral Oral Oral Oral  SpO2:  100% 98% 99%  Weight:  50.5 kg    Height:  4\' 11"  (1.499 m)      Intake/Output Summary (Last 24 hours) at 06/21/2020 0916 Last data filed at 06/21/2020 0300 Gross per 24 hour  Intake 120 ml  Output --  Net 120 ml   Last 3 Weights 06/21/2020 07/05/2019 04/08/2019  Weight (lbs) 111 lb 5.3 oz 115 lb 111 lb 9.6 oz  Weight (kg) 50.5 kg 52.164 kg 50.621 kg     Body mass index is 22.49 kg/m.  General:  Well nourished, elderly female appearing in no acute distress HEENT: normal Lymph: no adenopathy Neck: no JVD Endocrine:  No thryomegaly Vascular: No carotid bruits; FA pulses 2+ bilaterally without bruits  Cardiac:  normal S1, S2; RRR; no murmur  Lungs:  clear to auscultation bilaterally, no wheezing, rhonchi or rales  Abd: soft, nontender, no hepatomegaly  Ext: no edema Musculoskeletal:  No deformities, BUE and BLE strength normal and equal Skin: warm and dry  Neuro:  CNs 2-12 intact, no focal abnormalities noted Psych:  Normal  affect   EKG:  The EKG was personally reviewed and demonstrates: NSR, HR 87 with LBBB.   Telemetry:  Telemetry was personally reviewed and demonstrates:  NSR, HR in 60's to 70's with no significant arrhythmias.   Relevant CV Studies:  Echocardiogram: Pending  Laboratory Data:  High Sensitivity Troponin:   Recent Labs  Lab 06/20/20 1728 06/20/20 2013 06/21/20 0337 06/21/20 0545  TROPONINIHS 10 24* 36* 33*     Chemistry  Recent Labs  Lab 06/20/20 1728  NA 134*  K 4.4  CL 105  CO2 20*  GLUCOSE 184*  BUN 19  CREATININE 0.98  CALCIUM 9.3  GFRNONAA 59*  ANIONGAP 9    No results for input(s): PROT, ALBUMIN, AST, ALT, ALKPHOS, BILITOT in the last 168 hours. Hematology Recent Labs  Lab 06/20/20 1728  WBC 7.4  RBC 4.22  HGB 12.6  HCT 39.0  MCV 92.4  MCH 29.9  MCHC 32.3  RDW 12.1  PLT 368   BNPNo results for input(s): BNP, PROBNP in the last 168 hours.  DDimer No results for input(s): DDIMER in the last 168 hours.   Radiology/Studies:  DG Chest 2 View  Result Date: 06/20/2020 CLINICAL DATA:  Chest pain EXAM: CHEST - 2 VIEW COMPARISON:  10/20/2005 FINDINGS: The heart size and mediastinal contours are within normal limits. Both lungs are clear. Scoliosis of the spine. Aortic atherosclerosis. IMPRESSION: No active cardiopulmonary disease. Electronically Signed   By: Donavan Foil M.D.   On: 06/20/2020 18:14     Assessment and Plan:   1. Chest Pain with Mixed Features - Her episodes have occurred at rest or with activity and spontaneously resolve within several minutes. HS Troponin values have been flat at 10 with repeat values of 24, 36 and 33. EKG shows NSR with underlying LBBB and no recent tracings available for comparison.  - Reviewed with Dr. Johnsie Cancel and will plan for an echocardiogram today. If no significant abnormalities, would anticipate an outpatient Lexiscan Myoview given her symptoms and cardiac risk factors. If EF reduced or significant WMA, would plan  for inpatient ischemic evaluation.  - Continue ASA 81mg  daily, Toprol-XL 25mg  daily and Simvastatin 10mg  daily.   2. Accelerated HTN - BP initially at 186/104, improving with administration of SL NTG. Stable at 127/56 this AM. Will restart PTA Toprol-XL 25mg  daily.   3. HLD - Will obtain a repeat FLP. Continue PTA Simvastatin 10mg  daily.   4. Diarrhea - No recurrent episodes since admission. Further evaluation per admitting team if recurrence.   Risk Assessment/Risk Scores:     HEAR Score (for undifferentiated chest pain):  HEAR Score: 6   For questions or updates, please contact [facility] Please consult [email or web address] for contact info under    Signed, Erma Heritage, PA-C  06/21/2020 9:16 AM  Patient examined chart reviewed Discussed care with patient and PA Exam with kyphotic frail female Clear lungs no murmur soft abdomen no edema and palpable DP/PT bilaterally. Chest pains atypical ECG with LBBB troponin not elevated and no trend TTE if normal can be d/c for outpatient myovue   Tiffany Rouge MD [facility]

## 2020-06-21 NOTE — H&P (Signed)
History and Physical  [name] XHB:[id number] DOB: Nov 17, 1940 DOA: 06/20/2020  Referring physician: Evalee Jefferson, PA-C PCP: [name], PA-C  Patient coming from: Home  Chief Complaint; chest pain  HPI: [name] is a 80 y.o. female with medical history significant for hypertension, hyperlipidemia, T2DM and asthma who presents to the emergency department due to 1 week history of intermittent left-sided chest pressure with radiation to left scapula area which occurs both at rest and in ambulation.  Symptoms recur more frequently during the week and was worse yesterday (2/15) while at rest.  She states that she took BP and it was 810 systolic.  She states that she has been compliant with her BP meds. She endorsed 2-day onset of loose bowel movement with 4 episodes of nonbloody watery stools within this time period.  Denies fever, chills, shortness of breath, cough, headache or vision changes.  ED Course: In the emergency department, BP was elevated at 199/70, but other vital signs were within normal range.  Work-up in the ED showed normal CBC, and hyperglycemia.  Troponin x2- 10 > 4.  SARS coronavirus was negative. Chest x-ray showed no active cardiopulmonary disease.  Aspirin 325 Mg x1 was given, IV hydration was provided and nitroglycerin was given with improved BP and resolved chest pain.  Hospitalist was asked to admit patient for further evaluation and management.  Review of Systems: Constitutional: Negative for chills and fever.  HENT: Negative for ear pain and sore throat.   Eyes: Negative for pain and visual disturbance.  Respiratory: Negative for cough, chest tightness and shortness of breath.   Cardiovascular: Positive for chest pain and negative for palpitations.  Gastrointestinal: Positive for diarrhea.  Negative for abdominal pain and vomiting.  Endocrine: Negative for polyphagia and polyuria.  Genitourinary: Negative for decreased urine volume, dysuria Musculoskeletal:  Negative for arthralgias and back pain.  Skin: Negative for color change and rash.  Allergic/Immunologic: Negative for immunocompromised state.  Neurological: Negative for tremors, syncope, speech difficulty, weakness, light-headedness and headaches.  Hematological: Does not bruise/bleed easily.  All other systems reviewed and are negative   Past Medical History:  Diagnosis Date  . Asthma   . Diabetes ([place])   . HTN (hypertension)   . Hyperlipemia    Past Surgical History:  Procedure Laterality Date  . ABDOMINAL HYSTERECTOMY     PROLAPSED   . COLONOSCOPY N/A 07/05/2019   Procedure: COLONOSCOPY;  Surgeon: Danie Binder, MD;  [facility];  Service: Endoscopy;  Laterality: N/A;  1:00pm  . COLONOSCOPY W/ POLYPECTOMY  11/2007   SIMPLE ADENOMAS  . OVARIAN CYST REMOVAL    . POLYPECTOMY  07/05/2019   Procedure: POLYPECTOMY;  Surgeon: Danie Binder, MD;  [facility];  Service: Endoscopy;;    Social History:  reports that she has quit smoking. She has never used smokeless tobacco. She reports previous alcohol use. She reports previous drug use.   No Known Allergies  Family History  Problem Relation Age of Onset  . Colon cancer Mother        AGE > 71  . Colon polyps Neg Hx      Prior to Admission medications   Medication Sig Start Date End Date Taking? Authorizing Provider  albuterol (VENTOLIN HFA) 108 (90 Base) MCG/ACT inhaler Inhale 1-2 puffs into the lungs every 6 (six) hours as needed for wheezing or shortness of breath.  03/17/19  Yes [provider]  Cyanocobalamin (VITAMIN B-12 PO) Take 1 tablet by  mouth 3 (three) times a week.   Yes [provider]  metFORMIN (GLUCOPHAGE) 500 MG tablet Take 500 mg by mouth daily. 03/16/19  Yes [provider]  metoprolol succinate (TOPROL-XL) 25 MG 24 hr tablet Take 25 mg by mouth daily. 03/17/19  Yes [provider]  simvastatin (ZOCOR) 10 MG tablet Take 10 mg by mouth at bedtime.  03/16/19  Yes [provider]  SYMBICORT 160-4.5 MCG/ACT inhaler Inhale 2 puffs into the lungs 2 (two) times daily. 10/26/18  Yes [provider]  montelukast (SINGULAIR) 10 MG tablet  02/09/20   [provider]  Omega-3 Fatty Acids (OMEGA-3 FISH OIL PO) Take 1 capsule by mouth daily.  Patient not taking: No sig reported    [provider]    Physical Exam: BP (!) 104/49 (BP Location: Left Arm)   Pulse 65   Temp 98.1 F (36.7 C) (Oral)   Resp 20   Ht 4\' 11"  (1.499 m)   Wt 50.5 kg   SpO2 98%   BMI 22.49 kg/m   . General: 80 y.o. year-old female well developed well nourished in no acute distress.  Alert and oriented x3. Marland Kitchen HEENT: NCAT, EOMI . Neck: Supple, trachea medial . Cardiovascular: Regular rate and rhythm with no rubs or gallops.  No thyromegaly or JVD noted.  2/4 pulses in all 4 extremities. Marland Kitchen Respiratory: Clear to auscultation with no wheezes or rales. Good inspiratory effort. . Abdomen: Soft nontender nondistended with normal bowel sounds x4 quadrants. . Muskuloskeletal: No cyanosis, clubbing or edema noted bilaterally . Neuro: CN II-XII intact, strength 5/5 x 4, sensation, reflexes intact . Skin: No ulcerative lesions noted or rashes . Psychiatry: Judgement and insight appear normal. Mood is appropriate for condition and setting          Labs on Admission:  Basic Metabolic Panel: Recent Labs  Lab 06/20/20 1728  NA 134*  K 4.4  CL 105  CO2 20*  GLUCOSE 184*  BUN 19  CREATININE 0.98  CALCIUM 9.3   Liver Function Tests: No results for input(s): AST, ALT, ALKPHOS, BILITOT, PROT, ALBUMIN in the last 168 hours. No results for input(s): LIPASE, AMYLASE in the last 168 hours. No results for input(s): AMMONIA in the last 168 hours. CBC: Recent Labs  Lab 06/20/20 1728  WBC 7.4  HGB 12.6  HCT 39.0  MCV 92.4  PLT 368   Cardiac Enzymes: No results for input(s): CKTOTAL, CKMB, CKMBINDEX, TROPONINI in the last 168 hours.  BNP  (last 3 results) No results for input(s): BNP in the last 8760 hours.  ProBNP (last 3 results) No results for input(s): PROBNP in the last 8760 hours.  CBG: Recent Labs  Lab 06/21/20 0124  GLUCAP 124*    Radiological Exams on Admission: DG Chest 2 View  Result Date: 06/20/2020 CLINICAL DATA:  Chest pain EXAM: CHEST - 2 VIEW COMPARISON:  10/20/2005 FINDINGS: The heart size and mediastinal contours are within normal limits. Both lungs are clear. Scoliosis of the spine. Aortic atherosclerosis. IMPRESSION: No active cardiopulmonary disease. Electronically Signed   By: Donavan Foil M.D.   On: 06/20/2020 18:14    EKG: I independently viewed the EKG done and my findings are as followed: Normal sinus rhythm at rate of 87 bpm with T wave inversion in inferolateral leads  Assessment/Plan Present on Admission: . Chest pain . Essential hypertension  Principal Problem:   Chest pain Active Problems:   Hyperlipidemia   Essential hypertension   Hypertensive urgency  Diarrhea   Hyperglycemia due to diabetes mellitus (HCC)   Asthma  Chest pain rule out ACS Chest pain has resolved at this time, aspirin and nitroglycerin were given Troponin x2 - 10 > 24, continue to trend troponin Heart score = 5  Continue telemetry  EKG showed normal sinus rhythm at a rate of 87 bpm with T wave inversion in inferolateral leads Cardiology consult to help decide if Stress test is needed in am Versus other diagnostic modalities.   Continue aspirin, nitroglycerin  Hypertensive urgency (resolved) Essential hypertension-controlled Hold BP meds due to soft BP  Acute diarrhea Patient has not had any diarrhea since arrival to the ED Continue to monitor and treat accordingly  Hyperlipidemia Continue Zocor  Hyperglycemia secondary to T2DM Continue ISS and hypoglycemic control  Asthma Continue Ventolin and Dulera   DVT prophylaxis: Lovenox  Code Status: Full code  Family Communication: None at  bedside  Disposition Plan:  Patient is from:                        home Anticipated DC to:                   SNF or family members home Anticipated DC date:               1 day Anticipated DC barriers:           Patient requires cardiac work-up to rule out ACS  Consults called: Cardiology  Admission status: Observation    Bernadette Hoit MD [facility]  06/21/2020, 2:41 AM

## 2020-06-21 NOTE — Progress Notes (Signed)
2 D echo completed 

## 2020-06-22 LAB — HEMOGLOBIN A1C
Hgb A1c MFr Bld: 6.8 % — ABNORMAL HIGH (ref 4.8–5.6)
Mean Plasma Glucose: 148 mg/dL

## 2020-06-28 NOTE — Progress Notes (Signed)
Cardiology Office Note  Date: 06/29/2020   ID: WARREN KUGELMAN, DOB [birth year], MRN [id number]  PCP:  [name], PA-C  Cardiologist:  Jenkins Rouge, MD Electrophysiologist:  None   Chief Complaint: Hospital follow up  History of Present Illness: [name] is a 80 y.o. female with a history of HTN, HLD, DM2, asthma.  Recent presentation to [facility] for chest pain on 06/20/2020.  She voiced a 2-week history of intermittent left-sided chest pressure with radiation to left scapular occurring at rest and with activity.  Recurring more frequently during the week course on day prior to presentation while at rest.  Her blood pressure was elevated at 962 systolic.  She had been compliant with all of her blood pressure medications.  Also complained of 2-day history of loose bowel movements with 4 episodes of nonbloody watery stools.  ED work-up showed normal CBC.  Troponins x2 10-4.  SARS Covid 2 was negative.  Chest x-ray no acute process.  She was given aspirin 325x1 IV hydration and nitroglycerin.  Blood pressure improved, chest pain resolved.  Seen by cardiology inpatient 2D echo was ordered no signs of scarring or ischemia.  No WMA's and normal EF.  She was stable to discharge and to follow-up with cardiology for outpatient [facility].  She was discharged on aspirin, Toprol, simvastatin.  Patient is here today without any particular complaints.  She states when she has the chest pain it is only mild.  She states the pain is primarily in her back.  She denies any radiation to neck, arm, jaw.  Denies any associated nausea, vomiting.  Or diaphoresis.  States she has been taking the sublingual nitroglycerin primarily for back pain.  Blood pressure is significantly elevated today at 192/90.  Blood pressure at recent visit to [facility] was also elevated at 836 systolic.  States she gets out of breath when she walks any distance.  She denies any orthostatic  symptoms/lightheadedness/dizziness/presyncope/syncope.  Admits to two-pillow orthopnea.  Denies PND.  No claudication-like symptoms, DVT or PE-like symptoms, or lower extremity edema.  Denies any bleeding issues.  Needs outpatient Lexiscan stress test  Past Medical History:  Diagnosis Date  . Asthma   . Diabetes ([place])   . HTN (hypertension)   . Hyperlipemia     Past Surgical History:  Procedure Laterality Date  . ABDOMINAL HYSTERECTOMY     PROLAPSED   . COLONOSCOPY N/A 07/05/2019   Procedure: COLONOSCOPY;  Surgeon: Danie Binder, MD;  [facility];  Service: Endoscopy;  Laterality: N/A;  1:00pm  . COLONOSCOPY W/ POLYPECTOMY  11/2007   SIMPLE ADENOMAS  . OVARIAN CYST REMOVAL    . POLYPECTOMY  07/05/2019   Procedure: POLYPECTOMY;  Surgeon: Danie Binder, MD;  Location: [facility];  Service: Endoscopy;;    Current Outpatient Medications  Medication Sig Dispense Refill  . albuterol (VENTOLIN HFA) 108 (90 Base) MCG/ACT inhaler Inhale 1-2 puffs into the lungs every 6 (six) hours as needed for wheezing or shortness of breath.     Marland Kitchen aspirin EC 81 MG EC tablet Take 1 tablet (81 mg total) by mouth daily. Swallow whole. 30 tablet 11  . Cyanocobalamin (VITAMIN B-12 PO) Take 1 tablet by mouth 3 (three) times a week.    . metFORMIN (GLUCOPHAGE) 500 MG tablet Take 500 mg by mouth daily.    . metoprolol succinate (TOPROL-XL) 50 MG 24 hr tablet Take 50 mg by mouth daily.    . nitroGLYCERIN (  NITROSTAT) 0.3 MG SL tablet Place 1 tablet (0.3 mg total) under the tongue every 5 (five) minutes as needed for up to 3 doses for chest pain. 15 tablet 1  . omeprazole (PRILOSEC) 40 MG capsule Take 40 mg by mouth daily.    . simvastatin (ZOCOR) 10 MG tablet Take 10 mg by mouth at bedtime.    . SYMBICORT 160-4.5 MCG/ACT inhaler Inhale 2 puffs into the lungs 2 (two) times daily.     No current facility-administered medications for this visit.   Allergies:  Patient has no known allergies.    Social History: The patient  reports that she has quit smoking. She has never used smokeless tobacco. She reports previous alcohol use. She reports previous drug use.   Family History: The patient's family history includes Colon cancer in her mother.   ROS:  Please see the history of present illness. Otherwise, complete review of systems is positive for none.  All other systems are reviewed and negative.   Physical Exam: VS:  BP (!) 192/90 Comment: took about 8 am  Pulse (!) 56   Ht 4\' 11"  (1.499 m)   Wt 109 lb (49.4 kg)   SpO2 95%   BMI 22.02 kg/m , BMI Body mass index is 22.02 kg/m.  Wt Readings from Last 3 Encounters:  06/29/20 109 lb (49.4 kg)  06/21/20 111 lb 5.3 oz (50.5 kg)  07/05/19 115 lb (52.2 kg)    General: Patient appears comfortable at rest. HEENT: Conjunctiva and lids normal, oropharynx clear with moist mucosa. Neck: Supple, no elevated JVP or carotid bruits, no thyromegaly. Lungs: Clear to auscultation, nonlabored breathing at rest. Cardiac: Regular rate and rhythm, no S3 or significant systolic murmur, no pericardial rub. Abdomen: Soft, nontender, no hepatomegaly, bowel sounds present, no guarding or rebound. Extremities: No pitting edema, distal pulses 2+. Skin: Warm and dry. Musculoskeletal: No kyphosis. Neuropsychiatric: Alert and oriented x3, affect grossly appropriate.  ECG:  EKG 06/20/2020 normal sinus rhythm, nonspecific intraventricular block.  Marked T wave abnormality consider inferolateral ischemia.  Heart rate of 87.  Recent Labwork: 06/21/2020: ALT 14; AST 20; BUN 18; Creatinine, Ser 1.03; Hemoglobin 11.4; Magnesium 1.8; Platelets 318; Potassium 4.1; Sodium 133     Component Value Date/Time   CHOL 162 10/22/2007 0000   TRIG 166 10/22/2007 0000   HDL 32 10/22/2007 0000   CHOLHDL 5.1 10/22/2007 0000   VLDL 33 10/22/2007 0000   LDLCALC 97 10/22/2007 0000    Other Studies Reviewed Today:  Echocardiogram 06/21/2020 1. Left ventricular  ejection fraction, by estimation, is 60 to 65%. The left ventricle has normal function. The left ventricle has no regional wall motion abnormalities. There is mild left ventricular hypertrophy of the basal and septal segments. Left ventricular diastolic parameters were normal. 2. Right ventricular systolic function is normal. The right ventricular size is normal. 3. The mitral valve is degenerative. Trivial mitral valve regurgitation. No evidence of mitral stenosis. Moderate mitral annular calcification. 4. The aortic valve is tricuspid. There is moderate calcification of the aortic valve. There is moderate thickening of the aortic valve. Aortic valve regurgitation is mild. Mild aortic valve stenosis. 5. The inferior vena cava is normal in size with greater than 50% respiratory variability, suggesting right atrial pressure of 3 mmHg.  Assessment and Plan:  1. Chest pain, unspecified type   2. Essential hypertension   3. Mixed hyperlipidemia    1. Chest pain, unspecified type Needs outpatient Lexiscan stress test.  Continues with back pain primarily.  Sometimes has chest pain mostly at rest.  States she sometimes has some chest tightness when walking.  Also complains of dyspnea on exertion when ambulating.  Patient has a Lexiscan stress test scheduled for Monday, February 28.  I explained the risk and benefits detail with her and granddaughter.  Continue sublingual nitroglycerin as needed.  Continue aspirin 81 mg.  Continue Toprol-XL 50 mg daily.  2. Essential hypertension Blood pressure significantly elevated today at 192/90.  Blood pressure at recent hospital visit was 199 on arrival.  Start lisinopril 20 mg daily.  Get BMP in 2 weeks after starting lisinopril.  Start checking your blood pressures daily.  Bring recordings to next visit.  Continue Toprol-XL 50 mg daily.  3. Mixed hyperlipidemia Continue simvastatin 10 mg daily.  Medication Adjustments/Labs and Tests Ordered: Current  medicines are reviewed at length with the patient today.  Concerns regarding medicines are outlined above.   Disposition: Follow-up with Dr. Harl Bowie or APP 1 month.  Signed, Levell July, NP 06/29/2020 10:02 AM    [facility] [address], [place], [place] [address] Phone: [phone]; Fax: [phone]

## 2020-06-29 ENCOUNTER — Encounter: Payer: Self-pay | Admitting: Family Medicine

## 2020-06-29 ENCOUNTER — Ambulatory Visit: Payer: Medicare HMO | Admitting: Family Medicine

## 2020-06-29 VITALS — BP 192/90 | HR 56 | Ht 59.0 in | Wt 109.0 lb

## 2020-06-29 DIAGNOSIS — I10 Essential (primary) hypertension: Secondary | ICD-10-CM | POA: Diagnosis not present

## 2020-06-29 DIAGNOSIS — E782 Mixed hyperlipidemia: Secondary | ICD-10-CM | POA: Diagnosis not present

## 2020-06-29 DIAGNOSIS — R079 Chest pain, unspecified: Secondary | ICD-10-CM | POA: Diagnosis not present

## 2020-06-29 MED ORDER — LISINOPRIL 20 MG PO TABS: 20.0000 mg | ORAL_TABLET | Freq: Every day | ORAL | 6 refills | Status: DC

## 2020-06-29 NOTE — Patient Instructions (Addendum)
Medication Instructions:   Begin Lisinopril 20mg  daily.   Continue all other medications.    Labwork:  BMET, Mg - orders given today.   Please do in 2 weeks, around 07/14/2020.  Office will contact with results via phone or letter.    Testing/Procedures: none  Follow-Up: 1 month   Any Other Special Instructions Will Be Listed Below (If Applicable). Please keep BP log & bring to follow up visit.   If you need a refill on your cardiac medications before your next appointment, please call your pharmacy.

## 2020-07-03 ENCOUNTER — Other Ambulatory Visit: Payer: Self-pay

## 2020-07-03 ENCOUNTER — Encounter (HOSPITAL_BASED_OUTPATIENT_CLINIC_OR_DEPARTMENT_OTHER)
Admission: RE | Admit: 2020-07-03 | Discharge: 2020-07-03 | Disposition: A | Discharge status: Home / Self Care | Payer: Medicare HMO | Source: Ambulatory Visit | Attending: Cardiovascular Disease | Admitting: Cardiovascular Disease

## 2020-07-03 ENCOUNTER — Encounter (HOSPITAL_COMMUNITY): Payer: Self-pay

## 2020-07-03 ENCOUNTER — Encounter (HOSPITAL_COMMUNITY)
Admission: RE | Admit: 2020-07-03 | Discharge: 2020-07-03 | Disposition: A | Discharge status: Home / Self Care | Payer: Medicare HMO | Source: Ambulatory Visit | Attending: Cardiovascular Disease | Admitting: Cardiovascular Disease

## 2020-07-03 DIAGNOSIS — R079 Chest pain, unspecified: Secondary | ICD-10-CM | POA: Diagnosis not present

## 2020-07-03 LAB — NM MYOCAR MULTI W/SPECT W/WALL MOTION / EF
LV dias vol: 60 mL (ref 46–106)
LV sys vol: 24 mL
Peak HR: 107 {beats}/min
RATE: 0.41
Rest HR: 55 {beats}/min
SDS: 0
SRS: 8
SSS: 8
TID: 0.87

## 2020-07-03 MED ORDER — REGADENOSON 0.4 MG/5ML IV SOLN
INTRAVENOUS | Status: AC
  Administered 2020-07-03: 0.4 mg via INTRAVENOUS
  Filled 2020-07-03: qty 5

## 2020-07-03 MED ORDER — TECHNETIUM TC 99M TETROFOSMIN IV KIT
30.0000 | PACK | Freq: Once | INTRAVENOUS | Status: AC | PRN
  Administered 2020-07-03: 30.2 via INTRAVENOUS

## 2020-07-03 MED ORDER — TECHNETIUM TC 99M TETROFOSMIN IV KIT
10.0000 | PACK | Freq: Once | INTRAVENOUS | Status: AC | PRN
  Administered 2020-07-03: 9.57 via INTRAVENOUS

## 2020-07-03 MED ORDER — SODIUM CHLORIDE FLUSH 0.9 % IV SOLN
INTRAVENOUS | Status: AC
  Administered 2020-07-03: 10 mL via INTRAVENOUS
  Filled 2020-07-03: qty 10

## 2020-07-10 ENCOUNTER — Ambulatory Visit: Payer: Medicare HMO | Admitting: Physician Assistant

## 2020-07-12 ENCOUNTER — Other Ambulatory Visit: Payer: Self-pay

## 2020-07-12 ENCOUNTER — Other Ambulatory Visit (HOSPITAL_COMMUNITY)
Admission: RE | Admit: 2020-07-12 | Discharge: 2020-07-12 | Disposition: A | Discharge status: Home / Self Care | Payer: Medicare HMO | Source: Ambulatory Visit | Attending: Family Medicine | Admitting: Family Medicine

## 2020-07-12 DIAGNOSIS — I10 Essential (primary) hypertension: Secondary | ICD-10-CM | POA: Diagnosis not present

## 2020-07-12 LAB — BASIC METABOLIC PANEL
Anion gap: 9 (ref 5–15)
BUN: 18 mg/dL (ref 8–23)
CO2: 23 mmol/L (ref 22–32)
Calcium: 9.4 mg/dL (ref 8.9–10.3)
Chloride: 106 mmol/L (ref 98–111)
Creatinine, Ser: 1 mg/dL (ref 0.44–1.00)
GFR, Estimated: 57 mL/min — ABNORMAL LOW (ref >60)
Glucose, Bld: 124 mg/dL — ABNORMAL HIGH (ref 70–99)
Potassium: 4.6 mmol/L (ref 3.5–5.1)
Sodium: 138 mmol/L (ref 135–145)

## 2020-07-12 LAB — MAGNESIUM: Magnesium: 2 mg/dL (ref 1.7–2.4)

## 2020-07-14 ENCOUNTER — Telehealth: Payer: Self-pay | Admitting: *Deleted

## 2020-07-14 NOTE — Telephone Encounter (Signed)
-----   Message from Verta Ellen., NP sent at 07/12/2020  4:23 PM EST ----- Labs look good but glucose was elevated a little.

## 2020-07-20 DIAGNOSIS — I10 Essential (primary) hypertension: Secondary | ICD-10-CM | POA: Diagnosis not present

## 2020-07-20 DIAGNOSIS — R079 Chest pain, unspecified: Secondary | ICD-10-CM | POA: Diagnosis not present

## 2020-07-20 DIAGNOSIS — K219 Gastro-esophageal reflux disease without esophagitis: Secondary | ICD-10-CM | POA: Diagnosis not present

## 2020-07-20 DIAGNOSIS — Z6823 Body mass index (BMI) 23.0-23.9, adult: Secondary | ICD-10-CM | POA: Diagnosis not present

## 2020-07-24 NOTE — Telephone Encounter (Signed)
Laurine Blazer, LPN  07/13/4074 80:88 AM EDT Back to Top     Daughter (Tammy) notified. Copy to pcp.    Merlene Laughter, RN  07/19/2020 3:44 PM EDT      Called/no answer/mailbox full   Staci T Ashworth, CMA  07/14/2020 12:07 PM EST      LM to return call

## 2020-07-26 NOTE — Progress Notes (Signed)
Cardiology Office Note  Date: 07/27/2020   ID: [name], DOB 12-Jun-1940, MRN [id number]  PCP:  [name], PA-C  Cardiologist:  Jenkins Rouge, MD Electrophysiologist:  None   Chief Complaint: Follow-up hypertension/stress test  History of Present Illness: [name] is a 80 y.o. female with a history of HTN, HLD, DM2, asthma.  Recent presentation to [facility] for chest pain on 06/20/2020.  She voiced a 2-week history of intermittent left-sided chest pressure with radiation to left scapular occurring at rest and with activity.  Recurring more frequently during the week course on day prior to presentation while at rest.  Her blood pressure was elevated at 846 systolic.  She had been compliant with all of her blood pressure medications.  Also complained of 2-day history of loose bowel movements with 4 episodes of nonbloody watery stools.  ED work-up showed normal CBC.  Troponins x  10-4.  SARS Covid 2 was negative.  Chest x-ray no acute process.  She was given aspirin 325 x1 IV hydration and nitroglycerin.  Blood pressure improved, chest pain resolved.  Seen by cardiology inpatient 2D echo was ordered no signs of scarring or ischemia.  No WMA's and normal EF.  She was stable to discharge and to follow-up with cardiology for outpatient [facility].  She was discharged on aspirin, Toprol, simvastatin.   Here for follow up after recent stress test and initiation of Lisinopril for HTN.  Recent stress test was low risk.  She denies any current anginal or exertional symptoms.  No lightheadedness, dizziness, presyncopal or syncopal episodes.  No CVA or TIA-like symptoms.  No complaints of palpitations or arrhythmias.  No PND or orthopnea.  She states blood pressure has improved since starting lisinopril.  She denies any bleeding issues.  No claudication-like symptoms, DVT or PE-like symptoms, or lower extremity edema.  Past Medical History:  Diagnosis Date  . Asthma   . Diabetes ([place])   .  HTN (hypertension)   . Hyperlipemia     Past Surgical History:  Procedure Laterality Date  . ABDOMINAL HYSTERECTOMY     PROLAPSED   . COLONOSCOPY N/A 07/05/2019   Procedure: COLONOSCOPY;  Surgeon: Danie Binder, MD;  [facility];  Service: Endoscopy;  Laterality: N/A;  1:00pm  . COLONOSCOPY W/ POLYPECTOMY  11/2007   SIMPLE ADENOMAS  . OVARIAN CYST REMOVAL    . POLYPECTOMY  07/05/2019   Procedure: POLYPECTOMY;  Surgeon: Danie Binder, MD;  [facility];  Service: Endoscopy;;    Current Outpatient Medications  Medication Sig Dispense Refill  . albuterol (VENTOLIN HFA) 108 (90 Base) MCG/ACT inhaler Inhale 1-2 puffs into the lungs every 6 (six) hours as needed for wheezing or shortness of breath.     Marland Kitchen aspirin EC 81 MG EC tablet Take 1 tablet (81 mg total) by mouth daily. Swallow whole. 30 tablet 11  . Cyanocobalamin (VITAMIN B-12 PO) Take 1 tablet by mouth 3 (three) times a week.    . metFORMIN (GLUCOPHAGE) 500 MG tablet Take 500 mg by mouth daily.    . metoprolol succinate (TOPROL-XL) 50 MG 24 hr tablet Take 50 mg by mouth daily.    . nitroGLYCERIN (NITROSTAT) 0.3 MG SL tablet Place 1 tablet (0.3 mg total) under the tongue every 5 (five) minutes as needed for up to 3 doses for chest pain. 15 tablet 1  . omeprazole (PRILOSEC) 40 MG capsule Take 40 mg by mouth daily.    Marland Kitchen  simvastatin (ZOCOR) 10 MG tablet Take 10 mg by mouth at bedtime.    . SYMBICORT 160-4.5 MCG/ACT inhaler Inhale 2 puffs into the lungs 2 (two) times daily.    Marland Kitchen lisinopril (ZESTRIL) 40 MG tablet Take 1 tablet (40 mg total) by mouth daily. 30 tablet 6   No current facility-administered medications for this visit.   Allergies:  Patient has no known allergies.   Social History: The patient  reports that she has quit smoking. She has never used smokeless tobacco. She reports previous alcohol use. She reports previous drug use.   Family History: The patient's family history includes Colon cancer in  her mother.   ROS:  Please see the history of present illness. Otherwise, complete review of systems is positive for none.  All other systems are reviewed and negative.   Physical Exam: VS:  BP (!) 160/82   Pulse (!) 57   Ht 4\' 11"  (1.499 m)   Wt 107 lb 12.8 oz (48.9 kg)   SpO2 90%   BMI 21.77 kg/m , BMI Body mass index is 21.77 kg/m.  Wt Readings from Last 3 Encounters:  07/27/20 107 lb 12.8 oz (48.9 kg)  06/29/20 109 lb (49.4 kg)  06/21/20 111 lb 5.3 oz (50.5 kg)    General: Patient appears comfortable at rest. Neck: Supple, no elevated JVP or carotid bruits, no thyromegaly. Lungs: Clear to auscultation, nonlabored breathing at rest. Cardiac: Regular rate and rhythm, no S3 or significant systolic murmur, no pericardial rub. Extremities: No pitting edema, distal pulses 2+. Skin: Warm and dry. Musculoskeletal: No kyphosis. Neuropsychiatric: Alert and oriented x3, affect grossly appropriate.  ECG:  EKG 06/20/2020 normal sinus rhythm, nonspecific intraventricular block.  Marked T wave abnormality consider inferolateral ischemia.  Heart rate of 87.  Recent Labwork: 06/21/2020: ALT 14; AST 20; Hemoglobin 11.4; Platelets 318 07/12/2020: BUN 18; Creatinine, Ser 1.00; Magnesium 2.0; Potassium 4.6; Sodium 138     Component Value Date/Time   CHOL 162 10/22/2007 0000   TRIG 166 10/22/2007 0000   HDL 32 10/22/2007 0000   CHOLHDL 5.1 10/22/2007 0000   VLDL 33 10/22/2007 0000   LDLCALC 97 10/22/2007 0000    Other Studies Reviewed Today:  Lexiscan Stress 07/03/2020 Study Result  Narrative & Impression   There was no ST segment deviation noted during stress.  The study is normal. There are no perfusion defects consistent with prior infarct or current ischemia.  This is a low risk study.  The left ventricular ejection fraction is normal (55-65%).      Echocardiogram 06/21/2020 1. Left ventricular ejection fraction, by estimation, is 60 to 65%. The left ventricle has  normal function. The left ventricle has no regional wall motion abnormalities. There is mild left ventricular hypertrophy of the basal and septal segments. Left ventricular diastolic parameters were normal. 2. Right ventricular systolic function is normal. The right ventricular size is normal. 3. The mitral valve is degenerative. Trivial mitral valve regurgitation. No evidence of mitral stenosis. Moderate mitral annular calcification. 4. The aortic valve is tricuspid. There is moderate calcification of the aortic valve. There is moderate thickening of the aortic valve. Aortic valve regurgitation is mild. Mild aortic valve stenosis. 5. The inferior vena cava is normal in size with greater than 50% respiratory variability, suggesting right atrial pressure of 3 mmHg.  Assessment and Plan:  1. Chest pain, unspecified type   2. Essential hypertension   3. Mixed hyperlipidemia    1. Chest pain, unspecified type Denies any recent  chest pain or back pain.  Recent Lexiscan stress test was negative for ischemia and was considered low risk.  Continue aspirin 81 mg.  Continue Toprol-XL 50 mg daily.  Continue sublingual nitroglycerin as needed  2. Essential hypertension Blood pressure elevated today at 160/82 but improved since starting lisinopril at last visit..  Will increase lisinopril to 40 mg daily.  Continue Toprol-XL 50 mg daily.  Advised her to continue checking her blood pressure with goal of 130/80 or less.  Advised to call if consistently above 130/80.  We will get a follow-up basic metabolic panel and magnesium after initiation of increased dose of lisinopril.  3. Mixed hyperlipidemia Continue simvastatin 10 mg daily.  Medication Adjustments/Labs and Tests Ordered: Current medicines are reviewed at length with the patient today.  Concerns regarding medicines are outlined above.   Disposition: Follow-up with Dr. Harl Bowie or APP 3 months  Signed, Levell July, NP 07/27/2020 12:08 PM     [facility] [address], [place],  [address] Phone: [phone]; Fax: [phone]

## 2020-07-27 ENCOUNTER — Encounter: Payer: Self-pay | Admitting: Family Medicine

## 2020-07-27 ENCOUNTER — Ambulatory Visit (INDEPENDENT_AMBULATORY_CARE_PROVIDER_SITE_OTHER): Payer: Medicare HMO | Admitting: Family Medicine

## 2020-07-27 VITALS — BP 160/82 | HR 57 | Ht 59.0 in | Wt 107.8 lb

## 2020-07-27 DIAGNOSIS — R079 Chest pain, unspecified: Secondary | ICD-10-CM

## 2020-07-27 DIAGNOSIS — I10 Essential (primary) hypertension: Secondary | ICD-10-CM | POA: Diagnosis not present

## 2020-07-27 DIAGNOSIS — E782 Mixed hyperlipidemia: Secondary | ICD-10-CM

## 2020-07-27 MED ORDER — LISINOPRIL 40 MG PO TABS: 40.0000 mg | ORAL_TABLET | Freq: Every day | ORAL | 6 refills | Status: DC

## 2020-07-27 NOTE — Patient Instructions (Signed)
Medication Instructions:   Increase Lisinopril to 40mg  daily.   Continue all other medications.    Labwork:  BMET, Mg - orders given today.   Please do in 2 weeks (around 08/10/2020).   Office will contact with results via phone or letter.    Testing/Procedures: none  Follow-Up: 3 months   Any Other Special Instructions Will Be Listed Below (If Applicable).  If you need a refill on your cardiac medications before your next appointment, please call your pharmacy.

## 2020-11-02 ENCOUNTER — Ambulatory Visit: Payer: Medicare HMO | Admitting: Cardiology

## 2020-11-08 NOTE — Progress Notes (Deleted)
Cardiology Office Note  Date: 11/08/2020   ID: [name], DOB 03/26/1941, MRN [id number]  PCP:  [name], PA-C  Cardiologist:  Jenkins Rouge, MD Electrophysiologist:  None   Chief Complaint: 3 month follow up  History of Present Illness: [name] is a 80 y.o. female with a history of HTN, HLD, DM2, asthma.  Recent presentation to [facility] for chest pain on 06/20/2020.  She voiced a 2-week history of intermittent left-sided chest pressure with radiation to left scapular occurring at rest and with activity.  Recurring more frequently during the week course on day prior to presentation while at rest.  Her blood pressure was elevated at 163 systolic.  She had been compliant with all of her blood pressure medications.  Also complained of 2-day history of loose bowel movements with 4 episodes of nonbloody watery stools.  ED work-up showed normal CBC.  Troponins x  10-4.  SARS Covid 2 was negative.  Chest x-ray no acute process.  She was given aspirin 325 x1 IV hydration and nitroglycerin.  Blood pressure improved, chest pain resolved.  Seen by cardiology inpatient 2D echo was ordered no signs of scarring or ischemia.  No WMA's and normal EF.  She was stable to discharge and to follow-up with cardiology for outpatient [facility].  She was discharged on aspirin, Toprol, simvastatin.   Here for follow up after recent stress test and initiation of Lisinopril for HTN.  Recent stress test was low risk.  She denies any current anginal or exertional symptoms.  No lightheadedness, dizziness, presyncopal or syncopal episodes.  No CVA or TIA-like symptoms.  No complaints of palpitations or arrhythmias.  No PND or orthopnea.  She states blood pressure has improved since starting lisinopril.  She denies any bleeding issues.  No claudication-like symptoms, DVT or PE-like symptoms, or lower extremity edema.  Past Medical History:  Diagnosis Date   Asthma    Diabetes ([place])    HTN (hypertension)     Hyperlipemia     Past Surgical History:  Procedure Laterality Date   ABDOMINAL HYSTERECTOMY     PROLAPSED    COLONOSCOPY N/A 07/05/2019   Procedure: COLONOSCOPY;  Surgeon: Danie Binder, MD;  [facility];  Service: Endoscopy;  Laterality: N/A;  1:00pm   COLONOSCOPY W/ POLYPECTOMY  11/2007   SIMPLE ADENOMAS   OVARIAN CYST REMOVAL     POLYPECTOMY  07/05/2019   Procedure: POLYPECTOMY;  Surgeon: Danie Binder, MD;  Location: [facility];  Service: Endoscopy;;    Current Outpatient Medications  Medication Sig Dispense Refill   albuterol (VENTOLIN HFA) 108 (90 Base) MCG/ACT inhaler Inhale 1-2 puffs into the lungs every 6 (six) hours as needed for wheezing or shortness of breath.      aspirin EC 81 MG EC tablet Take 1 tablet (81 mg total) by mouth daily. Swallow whole. 30 tablet 11   Cyanocobalamin (VITAMIN B-12 PO) Take 1 tablet by mouth 3 (three) times a week.     lisinopril (ZESTRIL) 40 MG tablet Take 1 tablet (40 mg total) by mouth daily. 30 tablet 6   metFORMIN (GLUCOPHAGE) 500 MG tablet Take 500 mg by mouth daily.     metoprolol succinate (TOPROL-XL) 50 MG 24 hr tablet Take 50 mg by mouth daily.     nitroGLYCERIN (NITROSTAT) 0.3 MG SL tablet Place 1 tablet (0.3 mg total) under the tongue every 5 (five) minutes as needed for up to 3 doses for chest pain.  15 tablet 1   omeprazole (PRILOSEC) 40 MG capsule Take 40 mg by mouth daily.     simvastatin (ZOCOR) 10 MG tablet Take 10 mg by mouth at bedtime.     SYMBICORT 160-4.5 MCG/ACT inhaler Inhale 2 puffs into the lungs 2 (two) times daily.     No current facility-administered medications for this visit.   Allergies:  Patient has no known allergies.   Social History: The patient  reports that she has quit smoking. She has never used smokeless tobacco. She reports previous alcohol use. She reports previous drug use.   Family History: The patient's family history includes Colon cancer in her mother.   ROS:  Please see the  history of present illness. Otherwise, complete review of systems is positive for none.  All other systems are reviewed and negative.   Physical Exam: VS:  There were no vitals taken for this visit., BMI There is no height or weight on file to calculate BMI.  Wt Readings from Last 3 Encounters:  07/27/20 107 lb 12.8 oz (48.9 kg)  06/29/20 109 lb (49.4 kg)  06/21/20 111 lb 5.3 oz (50.5 kg)    General: Patient appears comfortable at rest. Neck: Supple, no elevated JVP or carotid bruits, no thyromegaly. Lungs: Clear to auscultation, nonlabored breathing at rest. Cardiac: Regular rate and rhythm, no S3 or significant systolic murmur, no pericardial rub. Extremities: No pitting edema, distal pulses 2+. Skin: Warm and dry. Musculoskeletal: No kyphosis. Neuropsychiatric: Alert and oriented x3, affect grossly appropriate.  ECG:  EKG 06/20/2020 normal sinus rhythm, nonspecific intraventricular block.  Marked T wave abnormality consider inferolateral ischemia.  Heart rate of 87.  Recent Labwork: 06/21/2020: ALT 14; AST 20; Hemoglobin 11.4; Platelets 318 07/12/2020: BUN 18; Creatinine, Ser 1.00; Magnesium 2.0; Potassium 4.6; Sodium 138     Component Value Date/Time   CHOL 162 10/22/2007 0000   TRIG 166 10/22/2007 0000   HDL 32 10/22/2007 0000   CHOLHDL 5.1 10/22/2007 0000   VLDL 33 10/22/2007 0000   LDLCALC 97 10/22/2007 0000    Other Studies Reviewed Today:  Lexiscan Stress 07/03/2020 Study Result  Narrative & Impression  There was no ST segment deviation noted during stress. The study is normal. There are no perfusion defects consistent with prior infarct or current ischemia. This is a low risk study. The left ventricular ejection fraction is normal (55-65%).      Echocardiogram 06/21/2020 1. Left ventricular ejection fraction, by estimation, is 60 to 65%. The left ventricle has normal function. The left ventricle has no regional wall motion abnormalities. There is mild  left ventricular hypertrophy of the basal and septal segments. Left ventricular diastolic parameters were normal. 2. Right ventricular systolic function is normal. The right ventricular size is normal. 3. The mitral valve is degenerative. Trivial mitral valve regurgitation. No evidence of mitral stenosis. Moderate mitral annular calcification. 4. The aortic valve is tricuspid. There is moderate calcification of the aortic valve. There is moderate thickening of the aortic valve. Aortic valve regurgitation is mild. Mild aortic valve stenosis. 5. The inferior vena cava is normal in size with greater than 50% respiratory variability, suggesting right atrial pressure of 3 mmHg.  Assessment and Plan:  1. Chest pain, unspecified type   2. Essential hypertension   3. Mixed hyperlipidemia     1. Chest pain, unspecified type Denies any recent chest pain or back pain.  Recent Lexiscan stress test was negative for ischemia and was considered low risk.  Continue aspirin 81  mg.  Continue Toprol-XL 50 mg daily.  Continue sublingual nitroglycerin as needed  2. Essential hypertension Blood pressure elevated today at 160/82 but improved since starting lisinopril at last visit..  Will increase lisinopril to 40 mg daily.  Continue Toprol-XL 50 mg daily.  Advised her to continue checking her blood pressure with goal of 130/80 or less.  Advised to call if consistently above 130/80.  We will get a follow-up basic metabolic panel and magnesium after initiation of increased dose of lisinopril.  3. Mixed hyperlipidemia Continue simvastatin 10 mg daily.  Medication Adjustments/Labs and Tests Ordered: Current medicines are reviewed at length with the patient today.  Concerns regarding medicines are outlined above.   Disposition: Follow-up with Dr. Harl Bowie or APP 3 months  Signed, Levell July, NP 11/08/2020 10:42 PM    [place] [facility] [place], [place],  [address] Phone:  [phone]; Fax: [phone]

## 2020-11-09 ENCOUNTER — Ambulatory Visit: Payer: Medicare HMO | Admitting: Family Medicine

## 2020-11-10 ENCOUNTER — Encounter: Payer: Self-pay | Admitting: Family Medicine

## 2021-04-10 DIAGNOSIS — M810 Age-related osteoporosis without current pathological fracture: Secondary | ICD-10-CM | POA: Diagnosis not present

## 2021-04-10 DIAGNOSIS — E1165 Type 2 diabetes mellitus with hyperglycemia: Secondary | ICD-10-CM | POA: Diagnosis not present

## 2021-04-10 DIAGNOSIS — R32 Unspecified urinary incontinence: Secondary | ICD-10-CM | POA: Diagnosis not present

## 2021-04-10 DIAGNOSIS — Z833 Family history of diabetes mellitus: Secondary | ICD-10-CM | POA: Diagnosis not present

## 2021-04-10 DIAGNOSIS — Z803 Family history of malignant neoplasm of breast: Secondary | ICD-10-CM | POA: Diagnosis not present

## 2021-04-10 DIAGNOSIS — Z7982 Long term (current) use of aspirin: Secondary | ICD-10-CM | POA: Diagnosis not present

## 2021-04-10 DIAGNOSIS — Z7984 Long term (current) use of oral hypoglycemic drugs: Secondary | ICD-10-CM | POA: Diagnosis not present

## 2021-04-10 DIAGNOSIS — J45909 Unspecified asthma, uncomplicated: Secondary | ICD-10-CM | POA: Diagnosis not present

## 2021-04-10 DIAGNOSIS — I10 Essential (primary) hypertension: Secondary | ICD-10-CM | POA: Diagnosis not present

## 2021-04-10 DIAGNOSIS — E785 Hyperlipidemia, unspecified: Secondary | ICD-10-CM | POA: Diagnosis not present

## 2021-06-29 DIAGNOSIS — E785 Hyperlipidemia, unspecified: Secondary | ICD-10-CM | POA: Diagnosis not present

## 2021-06-29 DIAGNOSIS — I10 Essential (primary) hypertension: Secondary | ICD-10-CM | POA: Diagnosis not present

## 2021-06-29 DIAGNOSIS — M6530 Trigger finger, unspecified finger: Secondary | ICD-10-CM | POA: Diagnosis not present

## 2021-06-29 DIAGNOSIS — Z6823 Body mass index (BMI) 23.0-23.9, adult: Secondary | ICD-10-CM | POA: Diagnosis not present

## 2021-06-29 DIAGNOSIS — E1165 Type 2 diabetes mellitus with hyperglycemia: Secondary | ICD-10-CM | POA: Diagnosis not present

## 2021-07-06 DIAGNOSIS — K219 Gastro-esophageal reflux disease without esophagitis: Secondary | ICD-10-CM | POA: Diagnosis not present

## 2021-07-06 DIAGNOSIS — I10 Essential (primary) hypertension: Secondary | ICD-10-CM | POA: Diagnosis not present

## 2021-07-06 DIAGNOSIS — E785 Hyperlipidemia, unspecified: Secondary | ICD-10-CM | POA: Diagnosis not present

## 2021-07-06 DIAGNOSIS — R946 Abnormal results of thyroid function studies: Secondary | ICD-10-CM | POA: Diagnosis not present

## 2022-01-22 IMAGING — DX DG CHEST 2V
2 series · 2 of 2 positions shown · non-contrast
Comparison: 10/20/2005

CLINICAL DATA: Chest pain

EXAM:
CHEST - 2 VIEW

[chest pa]
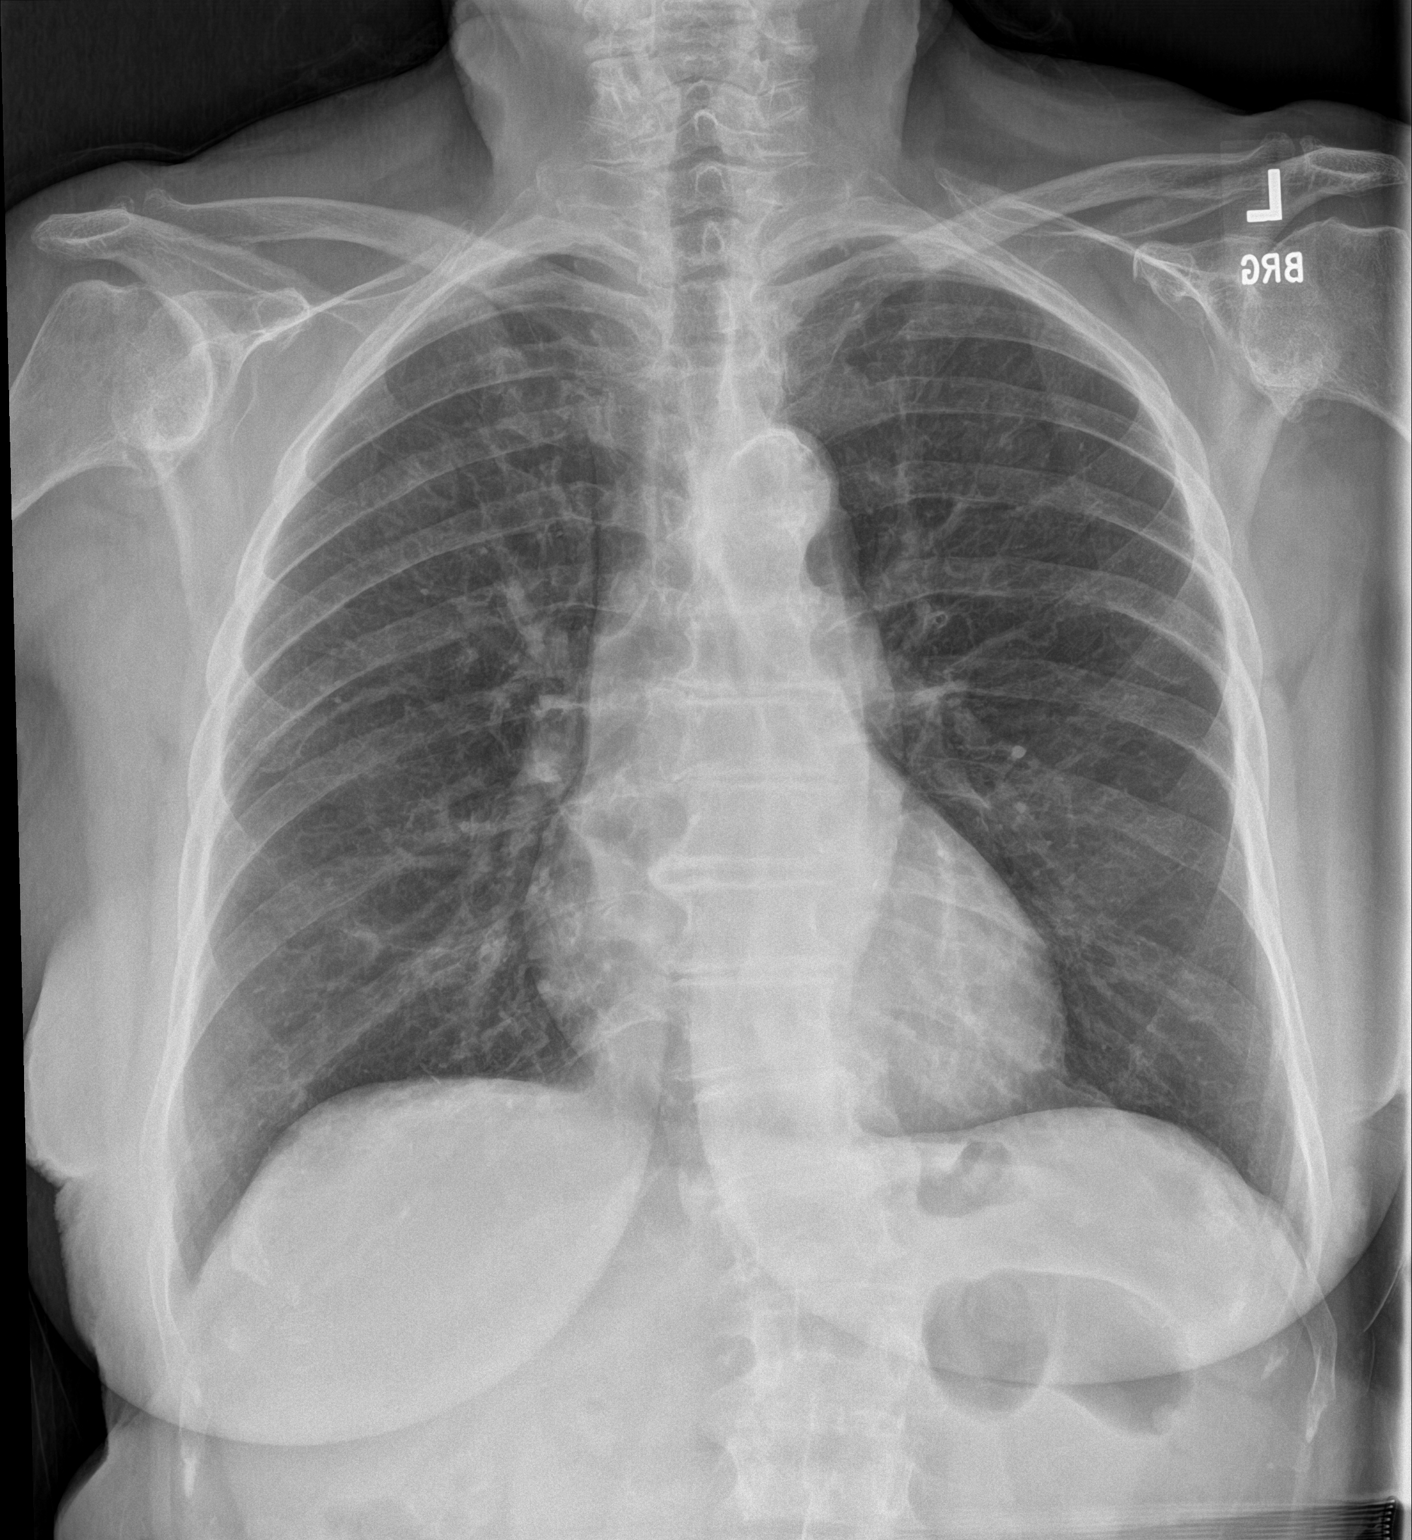

[chest lat]
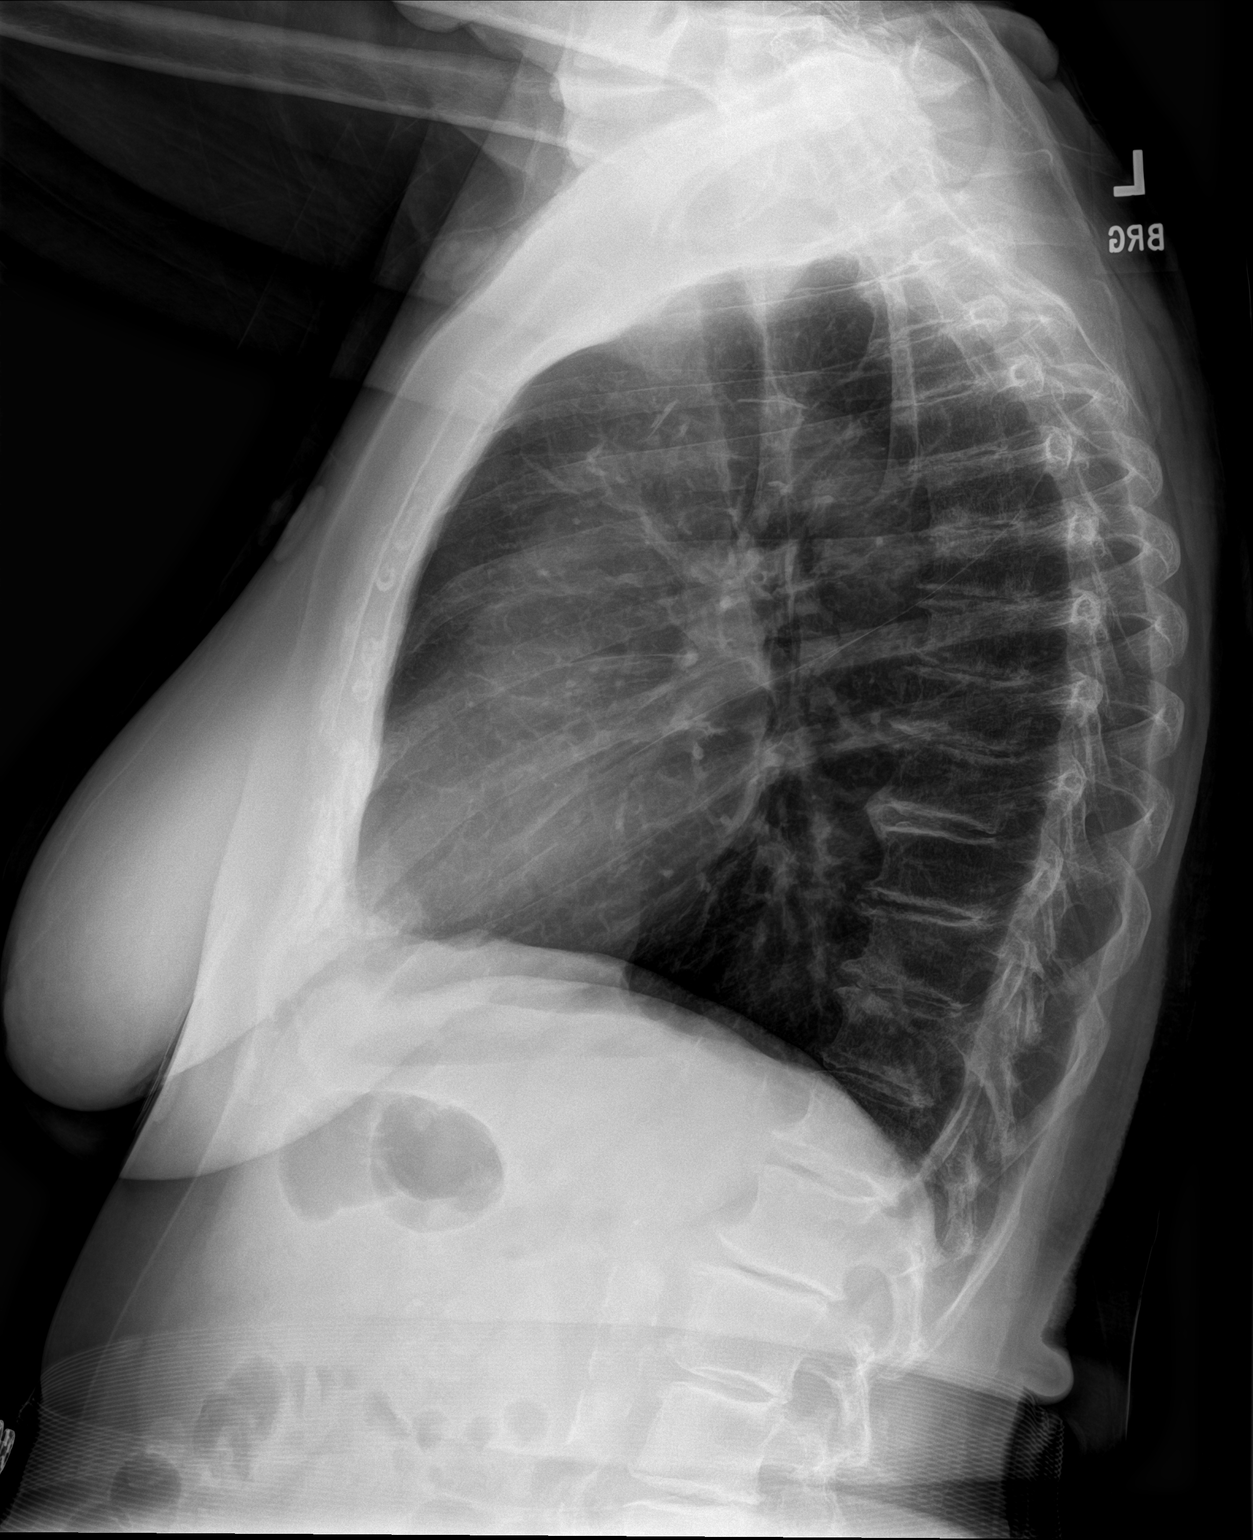

[2 of 2 positions shown; findings below may reference images not displayed]

FINDINGS: The heart size and mediastinal contours are within normal limits.
Both lungs are clear. Scoliosis of the spine. Aortic
atherosclerosis.
IMPRESSION: No active cardiopulmonary disease.

## 2022-04-15 DIAGNOSIS — H6991 Unspecified Eustachian tube disorder, right ear: Secondary | ICD-10-CM | POA: Diagnosis not present

## 2022-04-15 DIAGNOSIS — H903 Sensorineural hearing loss, bilateral: Secondary | ICD-10-CM | POA: Diagnosis not present

## 2022-05-10 DIAGNOSIS — Z6823 Body mass index (BMI) 23.0-23.9, adult: Secondary | ICD-10-CM | POA: Diagnosis not present

## 2022-05-10 DIAGNOSIS — Z23 Encounter for immunization: Secondary | ICD-10-CM | POA: Diagnosis not present

## 2022-05-10 DIAGNOSIS — Z1389 Encounter for screening for other disorder: Secondary | ICD-10-CM | POA: Diagnosis not present

## 2022-05-10 DIAGNOSIS — Z1331 Encounter for screening for depression: Secondary | ICD-10-CM | POA: Diagnosis not present

## 2022-05-10 DIAGNOSIS — E785 Hyperlipidemia, unspecified: Secondary | ICD-10-CM | POA: Diagnosis not present

## 2022-05-10 DIAGNOSIS — E1165 Type 2 diabetes mellitus with hyperglycemia: Secondary | ICD-10-CM | POA: Diagnosis not present

## 2022-05-10 DIAGNOSIS — I10 Essential (primary) hypertension: Secondary | ICD-10-CM | POA: Diagnosis not present

## 2022-05-15 DIAGNOSIS — E876 Hypokalemia: Secondary | ICD-10-CM | POA: Diagnosis not present

## 2022-05-27 DIAGNOSIS — H25811 Combined forms of age-related cataract, right eye: Secondary | ICD-10-CM | POA: Diagnosis not present

## 2022-05-27 DIAGNOSIS — H25813 Combined forms of age-related cataract, bilateral: Secondary | ICD-10-CM | POA: Diagnosis not present

## 2022-06-20 DIAGNOSIS — H01004 Unspecified blepharitis left upper eyelid: Secondary | ICD-10-CM | POA: Diagnosis not present

## 2022-06-20 DIAGNOSIS — H01002 Unspecified blepharitis right lower eyelid: Secondary | ICD-10-CM | POA: Diagnosis not present

## 2022-06-20 DIAGNOSIS — H2521 Age-related cataract, morgagnian type, right eye: Secondary | ICD-10-CM | POA: Diagnosis not present

## 2022-06-20 DIAGNOSIS — H01001 Unspecified blepharitis right upper eyelid: Secondary | ICD-10-CM | POA: Diagnosis not present

## 2022-06-20 DIAGNOSIS — H353122 Nonexudative age-related macular degeneration, left eye, intermediate dry stage: Secondary | ICD-10-CM | POA: Diagnosis not present

## 2022-06-27 DIAGNOSIS — H2521 Age-related cataract, morgagnian type, right eye: Secondary | ICD-10-CM | POA: Diagnosis not present

## 2022-07-01 ENCOUNTER — Encounter (HOSPITAL_COMMUNITY)
Admission: RE | Admit: 2022-07-01 | Discharge: 2022-07-01 | Disposition: A | Discharge status: Home / Self Care | Payer: Medicare HMO | Source: Ambulatory Visit | Attending: Ophthalmology | Admitting: Ophthalmology

## 2022-07-02 ENCOUNTER — Encounter (HOSPITAL_COMMUNITY): Payer: Self-pay

## 2022-07-02 NOTE — H&P (Signed)
Surgical History & Physical  Patient Name: [name] DOB: [birth year]  Surgery: Cataract extraction with intraocular lens implant phacoemulsification; Right Eye  Surgeon: Baruch Goldmann MD Surgery Date:  07-05-22 Pre-Op Date:  06-20-22  HPI: A 53 Yr. old female patient present for cataract eval per Dr. Jorja Loa. 1. 1. The patient complains of difficulty when recognizing people, which began 3 months ago. The right eye is affected. The episode is consta-nt. The condition's severity is worsening. This is negatively affecting the patient's quality of life and the patient is unable to function adequately in life with the current level of vision. (PC-IOL OS Dr. Herbert Deaner) HPI was performed by Baruch Goldmann .  Medical History: Macula Degeneration Cataracts Diabetes High Blood Pressure Lung Problems  Review of Systems Cardiovascular High Blood Pressure Respiratory Asthma All recorded systems are negative except as noted above.  Social   Never smoked   Medication Metformin, Metoprolol, Lisinopril, Simvastatin,   Sx/Procedures Phaco c IOL OS,  Hysterectomy,   Drug Allergies   NKDA  History & Physical: Heent: cataract, right eye NECK: supple without bruits LUNGS: lungs clear to auscultation CV: regular rate and rhythm Abdomen: soft and non-tender Impression & Plan: Assessment: 1.  CATARACT HYPERMATURE ([place]) AGE RELATED; Right Eye (H25.21) 2.  BLEPHARITIS; Right Upper Lid, Right Lower Lid, Left Upper Lid, Left Lower Lid (H01.001, H01.002,H01.004,H01.005) 3.  DERMATOCHALASIS, no surgery; Right Upper Lid, Left Upper Lid (H02.831, H02.834) 4.  PCO; Left Eye (H26.492) 5.  Pinguecula; Both Eyes (H11.153) 6.  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  INTRAOCULAR LENS IOL ; Left Eye (Z96.1)  Plan: 1.  Cataract accounts for the patient's decreased vision. This visual impairment is not correctable with a tolerable change in glasses or contact lenses.  Cataract surgery with an implantation of a new lens should significantly improve the visual and functional status of the patient. Discussed all risks, benefits, alternatives, and potential complications. Discussed the procedures and recovery. Patient desires to have surgery. A-scan ordered and performed today for intra-ocular lens calculations. The surgery will be performed in order to improve vision for driving, reading, and for eye examinations. Recommend phacoemulsification with intra-ocular lens. Recommend Dextenza for post-operative pain and inflammation. Right Eye. Surgery required to correct imbalance of vision. Dilates poorly - shugacaine by protocol. Vision [place]. Malyugin Ring. Omidira. Will try to obtain records from Dr. Herbert Deaner with measurements taken for left eye and IOL information.  2.  Recommend regular lid cleaning.  3.  Likely visually bothersome Monitor for now.  4.  Will address after cataract surgery.  5.  Observe; Artificial tears as needed for irritation.  6.  Start AREDS 2 vitamins. Monitor.  7.  Will try to obtain IOL calcs and lens choice from Dr. Herbert Deaner

## 2022-07-03 DIAGNOSIS — Z6823 Body mass index (BMI) 23.0-23.9, adult: Secondary | ICD-10-CM | POA: Diagnosis not present

## 2022-07-03 DIAGNOSIS — R0602 Shortness of breath: Secondary | ICD-10-CM | POA: Diagnosis not present

## 2022-07-03 DIAGNOSIS — R600 Localized edema: Secondary | ICD-10-CM | POA: Diagnosis not present

## 2022-07-03 DIAGNOSIS — I10 Essential (primary) hypertension: Secondary | ICD-10-CM | POA: Diagnosis not present

## 2022-07-05 ENCOUNTER — Ambulatory Visit (HOSPITAL_COMMUNITY): Payer: Medicare HMO | Admitting: Anesthesiology

## 2022-07-05 ENCOUNTER — Ambulatory Visit (HOSPITAL_COMMUNITY)
Admission: RE | Admit: 2022-07-05 | Discharge: 2022-07-05 | Disposition: A | Discharge status: Home / Self Care | Payer: Medicare HMO | Attending: Ophthalmology | Admitting: Ophthalmology

## 2022-07-05 ENCOUNTER — Encounter (HOSPITAL_COMMUNITY)
Admission: RE | Disposition: A | Discharge status: Home / Self Care | Payer: Self-pay | Source: Home / Self Care | Attending: Ophthalmology

## 2022-07-05 DIAGNOSIS — H259 Unspecified age-related cataract: Secondary | ICD-10-CM

## 2022-07-05 DIAGNOSIS — H2181 Floppy iris syndrome: Secondary | ICD-10-CM

## 2022-07-05 DIAGNOSIS — H0100A Unspecified blepharitis right eye, upper and lower eyelids: Secondary | ICD-10-CM | POA: Diagnosis not present

## 2022-07-05 DIAGNOSIS — Z87891 Personal history of nicotine dependence: Secondary | ICD-10-CM

## 2022-07-05 DIAGNOSIS — I10 Essential (primary) hypertension: Secondary | ICD-10-CM | POA: Insufficient documentation

## 2022-07-05 DIAGNOSIS — H0100B Unspecified blepharitis left eye, upper and lower eyelids: Secondary | ICD-10-CM | POA: Insufficient documentation

## 2022-07-05 DIAGNOSIS — Z7984 Long term (current) use of oral hypoglycemic drugs: Secondary | ICD-10-CM | POA: Diagnosis not present

## 2022-07-05 DIAGNOSIS — H353122 Nonexudative age-related macular degeneration, left eye, intermediate dry stage: Secondary | ICD-10-CM | POA: Insufficient documentation

## 2022-07-05 DIAGNOSIS — Z79899 Other long term (current) drug therapy: Secondary | ICD-10-CM | POA: Insufficient documentation

## 2022-07-05 DIAGNOSIS — E1136 Type 2 diabetes mellitus with diabetic cataract: Secondary | ICD-10-CM | POA: Insufficient documentation

## 2022-07-05 DIAGNOSIS — H02834 Dermatochalasis of left upper eyelid: Secondary | ICD-10-CM | POA: Insufficient documentation

## 2022-07-05 DIAGNOSIS — H11153 Pinguecula, bilateral: Secondary | ICD-10-CM | POA: Insufficient documentation

## 2022-07-05 DIAGNOSIS — J45909 Unspecified asthma, uncomplicated: Secondary | ICD-10-CM | POA: Insufficient documentation

## 2022-07-05 DIAGNOSIS — H02831 Dermatochalasis of right upper eyelid: Secondary | ICD-10-CM | POA: Insufficient documentation

## 2022-07-05 DIAGNOSIS — H2521 Age-related cataract, morgagnian type, right eye: Secondary | ICD-10-CM | POA: Diagnosis not present

## 2022-07-05 HISTORY — PX: CATARACT EXTRACTION W/PHACO: SHX586

## 2022-07-05 LAB — GLUCOSE, CAPILLARY: Glucose-Capillary: 123 mg/dL — ABNORMAL HIGH (ref 70–99)

## 2022-07-05 SURGERY — PHACOEMULSIFICATION, CATARACT, WITH IOL INSERTION
Anesthesia: Monitor Anesthesia Care | Site: Eye | Laterality: Right

## 2022-07-05 MED ORDER — MIDAZOLAM HCL 5 MG/5ML IJ SOLN
INTRAMUSCULAR | Status: DC | PRN
  Administered 2022-07-05: .5 mg via INTRAVENOUS

## 2022-07-05 MED ORDER — PHENYLEPHRINE-KETOROLAC 1-0.3 % IO SOLN
INTRAOCULAR | Status: DC | PRN
  Administered 2022-07-05: 500 mL via OPHTHALMIC

## 2022-07-05 MED ORDER — ROCURONIUM BROMIDE 10 MG/ML (PF) SYRINGE
PREFILLED_SYRINGE | INTRAVENOUS | Status: AC
  Filled 2022-07-05: qty 10

## 2022-07-05 MED ORDER — SODIUM HYALURONATE 23MG/ML IO SOSY
PREFILLED_SYRINGE | INTRAOCULAR | Status: DC | PRN
  Administered 2022-07-05: .6 mL via INTRAOCULAR

## 2022-07-05 MED ORDER — TRYPAN BLUE 0.06 % IO SOSY
PREFILLED_SYRINGE | INTRAOCULAR | Status: AC
  Filled 2022-07-05: qty 0.5

## 2022-07-05 MED ORDER — PHENYLEPHRINE HCL 2.5 % OP SOLN
1.0000 [drp] | OPHTHALMIC | Status: AC | PRN
  Administered 2022-07-05 (×3): 1 [drp] via OPHTHALMIC

## 2022-07-05 MED ORDER — ONDANSETRON HCL 4 MG/2ML IJ SOLN
INTRAMUSCULAR | Status: AC
  Filled 2022-07-05: qty 2

## 2022-07-05 MED ORDER — LIDOCAINE HCL (PF) 1 % IJ SOLN
INTRAOCULAR | Status: DC | PRN
  Administered 2022-07-05: 1 mL via OPHTHALMIC

## 2022-07-05 MED ORDER — NEOMYCIN-POLYMYXIN-DEXAMETH 3.5-10000-0.1 OP SUSP
OPHTHALMIC | Status: DC | PRN
  Administered 2022-07-05: 2 [drp] via OPHTHALMIC

## 2022-07-05 MED ORDER — DEXMEDETOMIDINE HCL IN NACL 80 MCG/20ML IV SOLN
INTRAVENOUS | Status: AC
  Filled 2022-07-05: qty 20

## 2022-07-05 MED ORDER — TRYPAN BLUE 0.06 % IO SOSY
PREFILLED_SYRINGE | INTRAOCULAR | Status: DC | PRN
  Administered 2022-07-05: .5 mL via INTRAOCULAR

## 2022-07-05 MED ORDER — BSS IO SOLN
INTRAOCULAR | Status: DC | PRN
  Administered 2022-07-05: 15 mL via INTRAOCULAR

## 2022-07-05 MED ORDER — SODIUM HYALURONATE 10 MG/ML IO SOLUTION
PREFILLED_SYRINGE | INTRAOCULAR | Status: DC | PRN
  Administered 2022-07-05: .85 mL via INTRAOCULAR

## 2022-07-05 MED ORDER — POVIDONE-IODINE 5 % OP SOLN
OPHTHALMIC | Status: DC | PRN
  Administered 2022-07-05: 1 via OPHTHALMIC

## 2022-07-05 MED ORDER — SUCCINYLCHOLINE CHLORIDE 200 MG/10ML IV SOSY
PREFILLED_SYRINGE | INTRAVENOUS | Status: AC
  Filled 2022-07-05: qty 10

## 2022-07-05 MED ORDER — LIDOCAINE HCL 3.5 % OP GEL
1.0000 | Freq: Once | OPHTHALMIC | Status: AC
  Administered 2022-07-05: 1 via OPHTHALMIC

## 2022-07-05 MED ORDER — MIDAZOLAM HCL 2 MG/2ML IJ SOLN
INTRAMUSCULAR | Status: AC
  Filled 2022-07-05: qty 2

## 2022-07-05 MED ORDER — TETRACAINE HCL 0.5 % OP SOLN
1.0000 [drp] | OPHTHALMIC | Status: AC | PRN
  Administered 2022-07-05 (×3): 1 [drp] via OPHTHALMIC

## 2022-07-05 MED ORDER — TROPICAMIDE 1 % OP SOLN
1.0000 [drp] | OPHTHALMIC | Status: AC | PRN
  Administered 2022-07-05 (×3): 1 [drp] via OPHTHALMIC

## 2022-07-05 MED ORDER — DEXAMETHASONE SODIUM PHOSPHATE 10 MG/ML IJ SOLN
INTRAMUSCULAR | Status: AC
  Filled 2022-07-05: qty 3

## 2022-07-05 MED ORDER — LIDOCAINE HCL (PF) 2 % IJ SOLN
INTRAMUSCULAR | Status: AC
  Filled 2022-07-05: qty 15

## 2022-07-05 MED ORDER — STERILE WATER FOR IRRIGATION IR SOLN
Status: DC | PRN
  Administered 2022-07-05: 250 mL

## 2022-07-05 MED ORDER — PHENYLEPHRINE 80 MCG/ML (10ML) SYRINGE FOR IV PUSH (FOR BLOOD PRESSURE SUPPORT)
PREFILLED_SYRINGE | INTRAVENOUS | Status: AC
  Filled 2022-07-05: qty 20

## 2022-07-05 SURGICAL SUPPLY — 15 items
CATARACT SUITE SIGHTPATH (MISCELLANEOUS) ×1 IMPLANT
CLOTH BEACON ORANGE TIMEOUT ST (SAFETY) ×1 IMPLANT
EYE SHIELD UNIVERSAL CLEAR (GAUZE/BANDAGES/DRESSINGS) IMPLANT
FEE CATARACT SUITE SIGHTPATH (MISCELLANEOUS) ×1 IMPLANT
GLOVE BIOGEL PI IND STRL 7.0 (GLOVE) ×2 IMPLANT
LENS IOL DIOP 16.5 (Intraocular Lens) ×1 IMPLANT
LENS IOL TECNIS MONO 16.5 (Intraocular Lens) IMPLANT
NDL HYPO 18GX1.5 BLUNT FILL (NEEDLE) ×1 IMPLANT
NEEDLE HYPO 18GX1.5 BLUNT FILL (NEEDLE) ×1 IMPLANT
PAD ARMBOARD 7.5X6 YLW CONV (MISCELLANEOUS) ×1 IMPLANT
RING MALYGIN 7.0 (MISCELLANEOUS) IMPLANT
SYR TB 1ML LL NO SAFETY (SYRINGE) ×1 IMPLANT
TAPE SURG TRANSPORE 1 IN (GAUZE/BANDAGES/DRESSINGS) IMPLANT
TAPE SURGICAL TRANSPORE 1 IN (GAUZE/BANDAGES/DRESSINGS) ×1
WATER STERILE IRR 250ML POUR (IV SOLUTION) ×1 IMPLANT

## 2022-07-05 NOTE — Discharge Instructions (Signed)
Please discharge patient when stable, will follow up today with Dr. Geneveive Furness at the [facility] office immediately following discharge.  Leave shield in place until visit.  All paperwork with discharge instructions will be given at the office.  [facility] Address:  [address]  [place], [place] [address]  

## 2022-07-05 NOTE — Op Note (Signed)
Date of procedure: 07/05/22  Pre-operative diagnosis: Visually significant mature cataract, Right Eye; Poor Dilation, Right Eye (H25.21)  Post-operative diagnosis: Visually significant age-related cataract, Right Eye; Intra-operative Floppy Iris Syndrome, Right Eye (H21.81)  Procedure: Removal of cataract via phacoemulsification and insertion of intra-ocular lens [employer] DCB00 +16.5D into the capsular bag of the Right Eye (CPT [phone])  Attending surgeon: Gerda Diss. Charlynn Salih, MD, MA  Anesthesia: MAC, Topical [place]  Complications: None  Estimated Blood Loss: <46m (minimal)  Specimens: None  Implants: As above  Indications:  Visually significant cataract, Right Eye  Procedure:  The patient was seen and identified in the pre-operative area. The operative eye was identified and dilated.  The operative eye was marked.  Topical anesthesia was administered to the operative eye.     The patient was then to the operative suite and placed in the supine position.  A timeout was performed confirming the patient, procedure to be performed, and all other relevant information.   The patient's face was prepped and draped in the usual fashion for intra-ocular surgery.  A lid speculum was placed into the operative eye and the surgical microscope moved into place and focused.  Poor dilation of the iris was confirmed.  A superotemporal paracentesis was created using a 20 gauge paracentesis blade. Vision blue was used to stain the anterior capsule. Shugarcaine was injected into the anterior chamber.  Viscoelastic was injected into the anterior chamber.  A temporal clear-corneal main wound incision was created using a 2.467mmicrokeratome.  A Malyugin ring was placed.  A continuous curvilinear capsulorrhexis was initiated using an irrigating cystitome and completed using capsulorrhexis forceps.  Hydrodissection and hydrodeliniation were performed.  Viscoelastic was injected into the anterior chamber.  A  phacoemulsification handpiece and a chopper as a second instrument were used to remove the nucleus and epinucleus. The irrigation/aspiration handpiece was used to remove any remaining cortical material.   The capsular bag was reinflated with viscoelastic, checked, and found to be intact.  The intraocular lens was inserted into the capsular bag and dialed into place using a MaSurveyor, minerals The Malyugin ring was removed.  The irrigation/aspiration handpiece was used to remove any remaining viscoelastic.  The clear corneal wound and paracentesis wounds were then hydrated and checked with Weck-Cels to be watertight. Maxitrol drops were instilled into the operative eye. The lid-speculum and drape was removed, and the patient's face was cleaned with a wet and dry 4x4. A clear shield was taped over the eye. The patient was taken to the post-operative care unit in good condition, having tolerated the procedure well.  Post-Op Instructions: [facility] and will receive all other orders and instructions.

## 2022-07-05 NOTE — Interval H&P Note (Signed)
History and Physical Interval Note:  07/05/2022 8:43 AM  [name]  has presented today for surgery, with the diagnosis of cataract hypermature age related cataract; right.  The various methods of treatment have been discussed with the patient and family. After consideration of risks, benefits and other options for treatment, the patient has consented to  Procedure(s): CATARACT EXTRACTION PHACO AND INTRAOCULAR LENS PLACEMENT ([place]) (Right) as a surgical intervention.  The patient's history has been reviewed, patient examined, no change in status, stable for surgery.  I have reviewed the patient's chart and labs.  Questions were answered to the patient's satisfaction.     Baruch Goldmann

## 2022-07-05 NOTE — Anesthesia Preprocedure Evaluation (Signed)
Anesthesia Evaluation  Patient identified by MRN, date of birth, ID band Patient awake    Reviewed: Allergy & Precautions, H&P , NPO status , Patient's Chart, lab work & pertinent test results, reviewed documented beta blocker date and time   Airway Mallampati: II  TM Distance: >3 FB Neck ROM: Full   Comment: Neck pain Dental  (+) Edentulous Upper, Edentulous Lower   Pulmonary asthma , former smoker   Pulmonary exam normal breath sounds clear to auscultation       Cardiovascular Exercise Tolerance: Good hypertension, Pt. on medications and Pt. on home beta blockers Normal cardiovascular exam Rhythm:Regular Rate:Normal     Neuro/Psych negative neurological ROS  negative psych ROS   GI/Hepatic Neg liver ROS,GERD  Medicated and Controlled,,  Endo/Other  diabetes, Well Controlled, Type 2, Oral Hypoglycemic Agents    Renal/GU negative Renal ROS  negative genitourinary   Musculoskeletal negative musculoskeletal ROS (+)    Abdominal   Peds negative pediatric ROS (+)  Hematology negative hematology ROS (+)   Anesthesia Other Findings Neck pain  Reproductive/Obstetrics negative OB ROS                             Anesthesia Physical Anesthesia Plan  ASA: 2  Anesthesia Plan: MAC   Post-op Pain Management: Minimal or no pain anticipated   Induction: Intravenous  PONV Risk Score and Plan: Treatment may vary due to age or medical condition  Airway Management Planned: Nasal Cannula and Natural Airway  Additional Equipment:   Intra-op Plan:   Post-operative Plan:   Informed Consent: I have reviewed the patients History and Physical, chart, labs and discussed the procedure including the risks, benefits and alternatives for the proposed anesthesia with the patient or authorized representative who has indicated his/her understanding and acceptance.     Dental advisory given  Plan  Discussed with: CRNA and Surgeon  Anesthesia Plan Comments:        Anesthesia Quick Evaluation

## 2022-07-05 NOTE — Anesthesia Postprocedure Evaluation (Signed)
Anesthesia Post Note  Patient: [name]  Procedure(s) Performed: CATARACT EXTRACTION PHACO AND INTRAOCULAR LENS PLACEMENT ([facility]) (Right: Eye)  Patient location during evaluation: Phase II Anesthesia Type: MAC Level of consciousness: awake and alert and oriented Pain management: pain level controlled Vital Signs Assessment: post-procedure vital signs reviewed and stable Respiratory status: spontaneous breathing, nonlabored ventilation and respiratory function stable Cardiovascular status: blood pressure returned to baseline and stable Postop Assessment: no apparent nausea or vomiting Anesthetic complications: no  No notable events documented.   Last Vitals:  Vitals:   07/05/22 0815 07/05/22 0930  BP: (!) 181/76 (!) 170/93  Pulse: (!) 53 60  Resp: 17 16  Temp:  37.1 C  SpO2: 100% 100%    Last Pain:  Vitals:   07/05/22 0930  TempSrc: Oral  PainSc: 0-No pain                 Yanni Ruberg C [name]

## 2022-07-05 NOTE — Transfer of Care (Signed)
Immediate Anesthesia Transfer of Care Note  Patient: [name]  Procedure(s) Performed: CATARACT EXTRACTION PHACO AND INTRAOCULAR LENS PLACEMENT ([facility]) (Right: Eye)  Patient Location: PACU  Anesthesia Type:MAC  Level of Consciousness: awake, alert , and oriented  Airway & Oxygen Therapy: Patient Spontanous Breathing  Post-op Assessment: Report given to RN and Post -op Vital signs reviewed and stable  Post vital signs: Reviewed and stable  Last Vitals:  Vitals Value Taken Time  BP    Temp    Pulse    Resp    SpO2      Last Pain:  Vitals:   07/05/22 0805  PainSc: 0-No pain         Complications: No notable events documented.

## 2022-07-18 ENCOUNTER — Encounter (HOSPITAL_COMMUNITY): Payer: Self-pay | Admitting: Ophthalmology

## 2022-07-29 DIAGNOSIS — I080 Rheumatic disorders of both mitral and aortic valves: Secondary | ICD-10-CM | POA: Diagnosis not present

## 2022-07-29 DIAGNOSIS — I272 Pulmonary hypertension, unspecified: Secondary | ICD-10-CM | POA: Diagnosis not present

## 2022-07-29 DIAGNOSIS — R0602 Shortness of breath: Secondary | ICD-10-CM | POA: Diagnosis not present

## 2022-07-29 DIAGNOSIS — I3481 Nonrheumatic mitral (valve) annulus calcification: Secondary | ICD-10-CM | POA: Diagnosis not present

## 2022-08-01 DIAGNOSIS — H26492 Other secondary cataract, left eye: Secondary | ICD-10-CM | POA: Diagnosis not present

## 2022-08-29 DIAGNOSIS — Z6822 Body mass index (BMI) 22.0-22.9, adult: Secondary | ICD-10-CM | POA: Diagnosis not present

## 2022-08-29 DIAGNOSIS — I10 Essential (primary) hypertension: Secondary | ICD-10-CM | POA: Diagnosis not present

## 2022-08-29 DIAGNOSIS — J209 Acute bronchitis, unspecified: Secondary | ICD-10-CM | POA: Diagnosis not present

## 2022-08-29 DIAGNOSIS — Z20828 Contact with and (suspected) exposure to other viral communicable diseases: Secondary | ICD-10-CM | POA: Diagnosis not present

## 2022-09-21 DIAGNOSIS — I10 Essential (primary) hypertension: Secondary | ICD-10-CM | POA: Diagnosis not present

## 2022-09-21 DIAGNOSIS — E7800 Pure hypercholesterolemia, unspecified: Secondary | ICD-10-CM | POA: Diagnosis not present

## 2022-09-21 DIAGNOSIS — E860 Dehydration: Secondary | ICD-10-CM | POA: Diagnosis not present

## 2022-09-21 DIAGNOSIS — E039 Hypothyroidism, unspecified: Secondary | ICD-10-CM | POA: Diagnosis not present

## 2022-09-21 DIAGNOSIS — R739 Hyperglycemia, unspecified: Secondary | ICD-10-CM | POA: Diagnosis not present

## 2022-09-21 DIAGNOSIS — E119 Type 2 diabetes mellitus without complications: Secondary | ICD-10-CM | POA: Diagnosis not present

## 2022-09-21 DIAGNOSIS — R42 Dizziness and giddiness: Secondary | ICD-10-CM | POA: Diagnosis not present

## 2022-10-01 DIAGNOSIS — I10 Essential (primary) hypertension: Secondary | ICD-10-CM | POA: Diagnosis not present

## 2022-10-01 DIAGNOSIS — R030 Elevated blood-pressure reading, without diagnosis of hypertension: Secondary | ICD-10-CM | POA: Diagnosis not present

## 2022-10-01 DIAGNOSIS — Z6823 Body mass index (BMI) 23.0-23.9, adult: Secondary | ICD-10-CM | POA: Diagnosis not present

## 2022-10-01 DIAGNOSIS — R7989 Other specified abnormal findings of blood chemistry: Secondary | ICD-10-CM | POA: Diagnosis not present

## 2022-10-08 DIAGNOSIS — R030 Elevated blood-pressure reading, without diagnosis of hypertension: Secondary | ICD-10-CM | POA: Diagnosis not present

## 2022-10-08 DIAGNOSIS — I10 Essential (primary) hypertension: Secondary | ICD-10-CM | POA: Diagnosis not present

## 2022-10-31 DIAGNOSIS — I10 Essential (primary) hypertension: Secondary | ICD-10-CM | POA: Diagnosis not present

## 2022-10-31 DIAGNOSIS — E785 Hyperlipidemia, unspecified: Secondary | ICD-10-CM | POA: Diagnosis not present

## 2022-10-31 DIAGNOSIS — E1165 Type 2 diabetes mellitus with hyperglycemia: Secondary | ICD-10-CM | POA: Diagnosis not present

## 2022-10-31 DIAGNOSIS — R030 Elevated blood-pressure reading, without diagnosis of hypertension: Secondary | ICD-10-CM | POA: Diagnosis not present

## 2022-10-31 DIAGNOSIS — Z6823 Body mass index (BMI) 23.0-23.9, adult: Secondary | ICD-10-CM | POA: Diagnosis not present

## 2022-12-13 DIAGNOSIS — Z6824 Body mass index (BMI) 24.0-24.9, adult: Secondary | ICD-10-CM | POA: Diagnosis not present

## 2022-12-13 DIAGNOSIS — M25561 Pain in right knee: Secondary | ICD-10-CM | POA: Diagnosis not present

## 2022-12-13 DIAGNOSIS — R030 Elevated blood-pressure reading, without diagnosis of hypertension: Secondary | ICD-10-CM | POA: Diagnosis not present

## 2022-12-13 DIAGNOSIS — M7989 Other specified soft tissue disorders: Secondary | ICD-10-CM | POA: Diagnosis not present

## 2022-12-16 DIAGNOSIS — I82411 Acute embolism and thrombosis of right femoral vein: Secondary | ICD-10-CM | POA: Diagnosis not present

## 2022-12-16 DIAGNOSIS — R2241 Localized swelling, mass and lump, right lower limb: Secondary | ICD-10-CM | POA: Diagnosis not present

## 2022-12-16 DIAGNOSIS — M7989 Other specified soft tissue disorders: Secondary | ICD-10-CM | POA: Diagnosis not present

## 2022-12-16 DIAGNOSIS — M79604 Pain in right leg: Secondary | ICD-10-CM | POA: Diagnosis not present

## 2022-12-17 ENCOUNTER — Encounter: Payer: Self-pay | Admitting: Nurse Practitioner

## 2022-12-17 ENCOUNTER — Ambulatory Visit: Payer: Medicare HMO | Attending: Nurse Practitioner | Admitting: Nurse Practitioner

## 2022-12-17 VITALS — BP 126/82 | HR 65 | Ht 59.0 in | Wt 114.4 lb

## 2022-12-17 DIAGNOSIS — Z7901 Long term (current) use of anticoagulants: Secondary | ICD-10-CM | POA: Diagnosis not present

## 2022-12-17 DIAGNOSIS — R600 Localized edema: Secondary | ICD-10-CM | POA: Diagnosis not present

## 2022-12-17 DIAGNOSIS — Z79899 Other long term (current) drug therapy: Secondary | ICD-10-CM

## 2022-12-17 DIAGNOSIS — I10 Essential (primary) hypertension: Secondary | ICD-10-CM | POA: Diagnosis not present

## 2022-12-17 DIAGNOSIS — I82411 Acute embolism and thrombosis of right femoral vein: Secondary | ICD-10-CM

## 2022-12-17 DIAGNOSIS — E7800 Pure hypercholesterolemia, unspecified: Secondary | ICD-10-CM

## 2022-12-17 DIAGNOSIS — I350 Nonrheumatic aortic (valve) stenosis: Secondary | ICD-10-CM | POA: Diagnosis not present

## 2022-12-17 DIAGNOSIS — E785 Hyperlipidemia, unspecified: Secondary | ICD-10-CM

## 2022-12-17 MED ORDER — SIMVASTATIN 20 MG PO TABS: 20.0000 mg | ORAL_TABLET | Freq: Every day | ORAL | 6 refills | Status: AC

## 2022-12-17 MED ORDER — NITROGLYCERIN 0.4 MG SL SUBL: 0.4000 mg | SUBLINGUAL_TABLET | SUBLINGUAL | 3 refills | Status: AC | PRN

## 2022-12-17 NOTE — Patient Instructions (Addendum)
Medication Instructions:   Nitroglycerin refilled today  Stop Aspirin  Check the dosage on the Lasix - 20mg  ? Increase Simvastatin to 20mg  daily  Continue all other medications.     Labwork:  CBC - order given - please do in 1 week FLP, LFT - orders given - please do in 6-8 weeks - Reminder:  Nothing to eat or drink after 12 midnight prior to labs.  Testing/Procedures:  Your physician has requested that you have an echocardiogram. Echocardiography is a painless test that uses sound waves to create images of your heart. It provides your doctor with information about the size and shape of your heart and how well your heart's chambers and valves are working. This procedure takes approximately one hour. There are no restrictions for this procedure. Please do NOT wear cologne, perfume, aftershave, or lotions (deodorant is allowed). Please arrive 15 minutes prior to your appointment time.   Follow-Up:  3-4 months   Any Other Special Instructions Will Be Listed Below (If Applicable).   If you need a refill on your cardiac medications before your next appointment, please call your pharmacy.

## 2022-12-17 NOTE — Progress Notes (Unsigned)
Cardiology Office Note:  .   Date:  12/17/2022 ID:  [name], DOB 1941-02-26, MRN [id number] PCP: Lianne Moris, PA-C  [facility] Providers Cardiologist:  Charlton Haws, MD    History of Present Illness: .   [name] is a 82 y.o. female with a PMH of chest pain, HTN, HLD, T2DM, aortic valve stenosis, and asthma, who presents today for scheduled follow-up.   NST in 2022 was low risk. Last seen by Rennis Harding, NP on July 27, 2020.  She was doing well at that time.  In the interim, she has been seen by [facility] this year. Echo performed 07/2022 performed for diagnosis of shortness of breath revealed EF > 55%, mild AR, mild to moderate aortic valve stenosis, mild pulmonary hypertension.  It appears she also was seen yesterday by provider Lianne Moris, PA with [facility] for leg swelling of right lower extremity, had Dopplers performed that revealed small amount of nonocclusive thrombus in proximal right femoral vein no other lower extremity DVT identified with moderate size mildly complex collection in popliteal fossa that could represent complex Baker's cyst or is less likely hematoma.  She presents today for follow-up.  She states she is doing well. Started Eliquis yesterday, tolerating well.  Does have some more swelling in the right lower leg compared to the left lower leg, states she is on Lasix for her leg swelling that has been chronic for many years.  She is unsure how much Lasix she takes daily.  I have asked her to verify the dosage of Lasix today and to give our office a call and let us know.  Overall doing well from a cardiac perspective. Granddaughter present for office visit states she was in [place] [facility] earlier this year for high BP, we have requested these records. Denies any chest pain, shortness of breath, palpitations, syncope, presyncope, dizziness, orthopnea, PND, significant weight changes, acute bleeding, or claudication.  SH: Enjoys doing puzzles in  her free time.  She has a total of 22 grandchildren-including grandchildren, great-grandchildren, and great great grandchildren.   ROS: Negative, see HPI.   Studies Reviewed: Marland Kitchen    EKG:  EKG Interpretation Date/Time:  Tuesday December 17 2022 10:43:13 EDT Ventricular Rate:  65 PR Interval:  176 QRS Duration:  82 QT Interval:  410 QTC Calculation: 426 R Axis:   67  Text Interpretation: Sinus rhythm with Premature atrial complexes with Abberant conduction ST & T wave abnormality When compared with ECG of 20-Jun-2020 16:48, Abberant conduction is now Present QRS duration has decreased ST no longer depressed in Lateral leads T wave inversion less evident in Inferior leads Nonspecific T wave abnormality has replaced inverted T waves in Lateral leads Confirmed by Sharlene Dory [phone]) on 12/17/2022 10:55:05 AM   Lexiscan 06/2020:  There was no ST segment deviation noted during stress. The study is normal. There are no perfusion defects consistent with prior infarct or current ischemia. This is a low risk study. The left ventricular ejection fraction is normal (55-65%).  Echo 06/2020:  1. Left ventricular ejection fraction, by estimation, is 60 to 65%. The  left ventricle has normal function. The left ventricle has no regional  wall motion abnormalities. There is mild left ventricular hypertrophy of  the basal and septal segments. Left  ventricular diastolic parameters were normal.   2. Right ventricular systolic function is normal. The right ventricular  size is normal.   3. The mitral valve is degenerative. Trivial mitral valve regurgitation.  No evidence of mitral stenosis. Moderate mitral annular calcification.   4. The aortic valve is tricuspid. There is moderate calcification of the  aortic valve. There is moderate thickening of the aortic valve. Aortic  valve regurgitation is mild. Mild aortic valve stenosis.   5. The inferior vena cava is normal in size with greater than 50%   respiratory variability, suggesting right atrial pressure of 3 mmHg.  Physical Exam:   VS:  BP 126/82   Pulse 65   Ht 4\' 11"  (1.499 m)   Wt 114 lb 6.4 oz (51.9 kg)   BMI 23.11 kg/m    Wt Readings from Last 3 Encounters:  12/17/22 114 lb 6.4 oz (51.9 kg)  07/27/20 107 lb 12.8 oz (48.9 kg)  06/29/20 109 lb (49.4 kg)    GEN: Thin, frail 82 y.o. female in no acute distress, chalazion/stye noted to left lower eye lid NECK: No JVD; No carotid bruits CARDIAC: S1/S2, RRR, Grade 1/6 murmur, no rubs, no gallops RESPIRATORY:  Clear to auscultation without rales, wheezing or rhonchi  ABDOMEN: Soft, non-tender, non-distended EXTREMITIES:  2+ edema to RLE, no edema to LLE; No deformity   ASSESSMENT AND PLAN: .    Acute DVT, on anticoagulant therapy, leg edema Diagnosed yesterday with [facility] provider along proximal right femoral vein. She was prescribed Eliquis 5 mg BID for her acute DVT, however after consulting clinical pharm D, we will increase this to Eliquis 10 mg BID x 1 week, then reduce to 5 mg BID dosing for around 6 months thereafter. Instructed her to stop Aspirin. I informed Hoover Brunette, LPN of this change with medications who will stated will update patient accordingly. Does have more leg edema to right lower extremity as expected from acute DVT. Encouraged her to continue Lasix, leg elevation, and low salt, heart healthy diet. Will obtain CBC in 1 week.  Aortic valve stenosis TTE 07/2022 with [facility] revealed mild to moderate aortic stenosis. Currently not a surgical candidate. Asymptomatic. Originally ordered Echo for further evaluation, however we will reschedule this for March 2025 or sooner if clinically indicated. Heart healthy diet and regular cardiovascular exercise encouraged. ED precautions discussed.  HTN BP stable. Continue lisinopril, and Toprol XL. Discussed to monitor BP at home at least 2 hours after medications and sitting for 5-10 minutes. Heart healthy diet and regular  cardiovascular exercise encouraged.   HLD, medication management LDL 05/2022 was 123. Goal < 100. Will increase simvastatin to 20 mg daily and repeat FLP and LFT in 2 months per protocol. Heart healthy diet and regular cardiovascular exercise encouraged.   Dispo: Will refill NTG per her request. Follow-up with me or APP in 3-4 months or sooner if anything changes.   Signed, Sharlene Dory, NP

## 2022-12-19 ENCOUNTER — Telehealth: Payer: Self-pay | Admitting: Cardiovascular Disease

## 2022-12-19 ENCOUNTER — Encounter: Payer: Self-pay | Admitting: Nurse Practitioner

## 2022-12-19 NOTE — Telephone Encounter (Signed)
Patient's daughter stated someone had contacted the patient's granddaughter to ask which medication the patient was taking. The patient's daughter stated the medication was furosemide (LASIX) 20 MG tablet. Please advise.

## 2022-12-20 NOTE — Progress Notes (Signed)
Sharlene Dory, NP already addressed.

## 2022-12-20 NOTE — Telephone Encounter (Signed)
Noted  

## 2022-12-31 ENCOUNTER — Other Ambulatory Visit: Payer: Medicare HMO

## 2023-01-08 DIAGNOSIS — Z79899 Other long term (current) drug therapy: Secondary | ICD-10-CM | POA: Diagnosis not present

## 2023-01-08 DIAGNOSIS — E7800 Pure hypercholesterolemia, unspecified: Secondary | ICD-10-CM | POA: Diagnosis not present

## 2023-01-09 LAB — CBC
Hematocrit: 34.1 % (ref 34.0–46.6)
Hemoglobin: 11.7 g/dL (ref 11.1–15.9)
MCH: 31.6 pg (ref 26.6–33.0)
MCHC: 34.3 g/dL (ref 31.5–35.7)
MCV: 92 fL (ref 79–97)
Platelets: 439 10*3/uL (ref 150–450)
RBC: 3.7 x10E6/uL — ABNORMAL LOW (ref 3.77–5.28)
RDW: 11.6 % — ABNORMAL LOW (ref 11.7–15.4)
WBC: 10 10*3/uL (ref 3.4–10.8)

## 2023-01-10 DIAGNOSIS — Z6824 Body mass index (BMI) 24.0-24.9, adult: Secondary | ICD-10-CM | POA: Diagnosis not present

## 2023-01-10 DIAGNOSIS — M25561 Pain in right knee: Secondary | ICD-10-CM | POA: Diagnosis not present

## 2023-01-10 DIAGNOSIS — R030 Elevated blood-pressure reading, without diagnosis of hypertension: Secondary | ICD-10-CM | POA: Diagnosis not present

## 2023-01-10 DIAGNOSIS — M7989 Other specified soft tissue disorders: Secondary | ICD-10-CM | POA: Diagnosis not present

## 2023-01-10 DIAGNOSIS — I82401 Acute embolism and thrombosis of unspecified deep veins of right lower extremity: Secondary | ICD-10-CM | POA: Diagnosis not present

## 2023-03-27 ENCOUNTER — Encounter: Payer: Self-pay | Admitting: Nurse Practitioner

## 2023-03-27 ENCOUNTER — Ambulatory Visit: Payer: Medicare HMO | Attending: Nurse Practitioner | Admitting: Nurse Practitioner

## 2023-03-27 VITALS — BP 120/78 | HR 75 | Ht 59.0 in | Wt 112.0 lb

## 2023-03-27 DIAGNOSIS — I10 Essential (primary) hypertension: Secondary | ICD-10-CM | POA: Diagnosis not present

## 2023-03-27 DIAGNOSIS — R600 Localized edema: Secondary | ICD-10-CM

## 2023-03-27 DIAGNOSIS — Z79899 Other long term (current) drug therapy: Secondary | ICD-10-CM

## 2023-03-27 DIAGNOSIS — Z7901 Long term (current) use of anticoagulants: Secondary | ICD-10-CM | POA: Diagnosis not present

## 2023-03-27 DIAGNOSIS — I350 Nonrheumatic aortic (valve) stenosis: Secondary | ICD-10-CM

## 2023-03-27 DIAGNOSIS — R0789 Other chest pain: Secondary | ICD-10-CM

## 2023-03-27 DIAGNOSIS — I82411 Acute embolism and thrombosis of right femoral vein: Secondary | ICD-10-CM

## 2023-03-27 DIAGNOSIS — E785 Hyperlipidemia, unspecified: Secondary | ICD-10-CM

## 2023-03-27 NOTE — Progress Notes (Addendum)
Cardiology Office Note:  .   Date:  03/27/2023 ID:  [name], DOB [birth year], MRN [id number] PCP: Lianne Moris, PA-C  [facility] Providers Cardiologist:  Charlton Haws, MD    History of Present Illness: .   [name] is a 82 y.o. female with a PMH of chest pain, HTN, HLD, T2DM, aortic valve stenosis, and asthma, who presents today for scheduled follow-up.   NST in 2022 was low risk. Last seen by Rennis Harding, NP on July 27, 2020.  She was doing well at that time.  In the interim, she has been seen by [facility] Cardiology this year. Echo performed 07/2022 performed for diagnosis of shortness of breath revealed EF > 55%, mild AR, mild to moderate aortic valve stenosis, mild pulmonary hypertension.  It appears she also was seen yesterday by provider Lianne Moris, PA with [facility] Card[facility] for leg swelling of right lower extremity, had Dopplers performed that revealed small amount of nonocclusive thrombus in proximal right femoral vein no other lower extremity DVT identified with moderate size mildly complex collection in popliteal fossa that could represent complex Baker's cyst or is less likely hematoma.  Last seen for follow-up on December 17, 2022. Was started on Eliquis the day before for acute DVT. Was noted to have more swelling in the right lower leg compared to the left lower leg, stated she was on Lasix for her leg swelling that has been chronic for many years.  Was overall doing well from a cardiac perspective. Denied any chest pain, shortness of breath, palpitations, syncope, presyncope, dizziness, orthopnea, PND, significant weight changes, acute bleeding, or claudication.  Today she presents for follow-up. Doing well. Has been tolerating medication well. Denies any shortness of breath, palpitations, syncope, presyncope, dizziness, orthopnea, PND, swelling or significant weight changes, acute bleeding, or claudication. Admits to recent atypical chest pain, less than 1 minute in duration  along left upper chest, near her shoulder. Rare in occurrence.   SH: Enjoys doing puzzles in her free time.  She has a total of 22 grandchildren-including grandchildren, great-grandchildren, and great great grandchildren. Lives with granddaugter who works as a Buyer, retail at [employer].   ROS: Negative, see HPI.   Studies Reviewed: Marland Kitchen    EKG: EKG is not ordered today.     Lexiscan 06/2020:  There was no ST segment deviation noted during stress. The study is normal. There are no perfusion defects consistent with prior infarct or current ischemia. This is a low risk study. The left ventricular ejection fraction is normal (55-65%).  Echo 06/2020:  1. Left ventricular ejection fraction, by estimation, is 60 to 65%. The  left ventricle has normal function. The left ventricle has no regional  wall motion abnormalities. There is mild left ventricular hypertrophy of  the basal and septal segments. Left  ventricular diastolic parameters were normal.   2. Right ventricular systolic function is normal. The right ventricular  size is normal.   3. The mitral valve is degenerative. Trivial mitral valve regurgitation.  No evidence of mitral stenosis. Moderate mitral annular calcification.   4. The aortic valve is tricuspid. There is moderate calcification of the  aortic valve. There is moderate thickening of the aortic valve. Aortic  valve regurgitation is mild. Mild aortic valve stenosis.   5. The inferior vena cava is normal in size with greater than 50%  respiratory variability, suggesting right atrial pressure of 3 mmHg.  Physical Exam:   VS:  BP 120/78  Pulse 75   Ht 4\' 11"  (1.499 m)   Wt 112 lb (50.8 kg)   SpO2 100%   BMI 22.62 kg/m    Wt Readings from Last 3 Encounters:  03/27/23 112 lb (50.8 kg)  12/17/22 114 lb 6.4 oz (51.9 kg)  07/27/20 107 lb 12.8 oz (48.9 kg)    GEN: Thin, frail 82 y.o. female in no acute distress NECK: No JVD; No carotid  bruits CARDIAC: S1/S2, RRR, Grade 1/6 murmur, no rubs, no gallops RESPIRATORY:  Clear to auscultation without rales, wheezing or rhonchi  ABDOMEN: Soft, non-tender, non-distended EXTREMITIES:  No edema to BLE, No deformity   ASSESSMENT AND PLAN: .    Atypical chest pain Very atypical in etiology, not bothersome per her report. Rare in occurrence. No triggers or component of exertion. No indication for ischemic evaluation at this time. Continue current medication regimen. Care and ED precautions discussed.   Acute DVT, on anticoagulant therapy Diagnosed in August 2024 with [facility] provider along proximal right femoral vein. Has been taking Eliquis. Was recommended to take Eliquis 5 mg BID dosing for around 6 months thereafter completing Eliquis 10 mg BID x 1 week. No edema on exam. Plan to arrange a lower extremity doppler at 6 months, which would be February 2025. Encouraged her to continue Lasix, leg elevation, and low salt, heart healthy diet.   Aortic valve stenosis TTE 07/2022 with [facility] revealed mild to moderate aortic stenosis. Currently not a surgical candidate. Asymptomatic. Will repeat Echo for March 2025 or sooner if clinically indicated. Heart healthy diet and regular cardiovascular exercise encouraged. ED precautions discussed.  HTN, medication management BP stable. On her med list, it says she is taking Lisinopril and Losartan, granddaughter is not sure if she is taking both. Instructed for granddaughter to check at home and let us know if she is taking Lisinopril, Losrtan, or both. Stated if taking both, to stop lisinopril and just take losartan and allow 36 hour washout period from lisinopril to losartan. Discussed to monitor BP at home at least 2 hours after medications and sitting for 5-10 minutes. Heart healthy diet and regular cardiovascular exercise encouraged. No medication changes at this time. Will obtain BMET STAT.   Addendum: Per chart review, pt and granddaughter have verified  she has just been taking Losartan and not Lisinopril and Losartan at the same time. Will obtain BMET to evaluate kidney function.   HLD LDL 01/2023 was 113. Goal < 100. Heart healthy diet and regular cardiovascular exercise encouraged. If no improvement in labs by next follow-up visit, plan to switch to stronger statin and/or add Zetia.   Dispo:Follow-up with me or APP in 6 months or sooner if anything changes.   Signed, Sharlene Dory, NP

## 2023-03-27 NOTE — Patient Instructions (Addendum)
Medication Instructions:  Your physician has recommended you make the following change in your medication:  Please Send [place] a message to confirm if you are taking both lisinopril and losartan. If so please stop Lisinopril   Labwork: BMET ASAP  Testing/Procedures: None   Follow-Up: Your physician recommends that you schedule a follow-up appointment in: 6 Months   Any Other Special Instructions Will Be Listed Below (If Applicable).  If you need a refill on your cardiac medications before your next appointment, please call your pharmacy.

## 2023-03-28 NOTE — Telephone Encounter (Signed)
-----   Message from Sharlene Dory sent at 03/28/2023  9:48 AM EST ----- Can we please arrange a lower extremity Doppler (venous) in early February 2025 to evaluate her acute DVT of her right lower extremity? Thanks so much!   Best,  Sharlene Dory, NP

## 2023-04-01 DIAGNOSIS — R600 Localized edema: Secondary | ICD-10-CM | POA: Diagnosis not present

## 2023-04-01 DIAGNOSIS — Z79899 Other long term (current) drug therapy: Secondary | ICD-10-CM | POA: Diagnosis not present

## 2023-04-01 LAB — BASIC METABOLIC PANEL
BUN/Creatinine Ratio: 14 (ref 12–28)
BUN: 15 mg/dL (ref 8–27)
CO2: 25 mmol/L (ref 20–29)
Calcium: 9.9 mg/dL (ref 8.7–10.3)
Chloride: 103 mmol/L (ref 96–106)
Creatinine, Ser: 1.1 mg/dL — ABNORMAL HIGH (ref 0.57–1.00)
Glucose: 260 mg/dL — ABNORMAL HIGH (ref 70–99)
Potassium: 5 mmol/L (ref 3.5–5.2)
Sodium: 136 mmol/L (ref 134–144)
eGFR: 50 mL/min/{1.73_m2} — ABNORMAL LOW (ref >59)

## 2023-04-14 DIAGNOSIS — K219 Gastro-esophageal reflux disease without esophagitis: Secondary | ICD-10-CM | POA: Diagnosis not present

## 2023-04-14 DIAGNOSIS — R739 Hyperglycemia, unspecified: Secondary | ICD-10-CM | POA: Diagnosis not present

## 2023-04-14 DIAGNOSIS — Z6824 Body mass index (BMI) 24.0-24.9, adult: Secondary | ICD-10-CM | POA: Diagnosis not present

## 2023-04-14 DIAGNOSIS — E7849 Other hyperlipidemia: Secondary | ICD-10-CM | POA: Diagnosis not present

## 2023-04-14 DIAGNOSIS — Z23 Encounter for immunization: Secondary | ICD-10-CM | POA: Diagnosis not present

## 2023-04-14 DIAGNOSIS — I82401 Acute embolism and thrombosis of unspecified deep veins of right lower extremity: Secondary | ICD-10-CM | POA: Diagnosis not present

## 2023-04-14 DIAGNOSIS — I10 Essential (primary) hypertension: Secondary | ICD-10-CM | POA: Diagnosis not present

## 2023-06-16 ENCOUNTER — Telehealth: Payer: Self-pay | Admitting: Nurse Practitioner

## 2023-06-16 NOTE — Telephone Encounter (Signed)
 Checking percert on the following patient for LE STUDY  07/30/2023 (in [facility] )

## 2023-07-30 ENCOUNTER — Ambulatory Visit: Payer: Medicare HMO

## 2023-07-30 ENCOUNTER — Ambulatory Visit: Payer: Medicare HMO | Attending: Nurse Practitioner

## 2023-07-30 DIAGNOSIS — I350 Nonrheumatic aortic (valve) stenosis: Secondary | ICD-10-CM | POA: Diagnosis not present

## 2023-07-30 DIAGNOSIS — I82411 Acute embolism and thrombosis of right femoral vein: Secondary | ICD-10-CM

## 2023-07-31 ENCOUNTER — Other Ambulatory Visit: Payer: Medicare HMO

## 2023-08-01 LAB — ECHOCARDIOGRAM COMPLETE
AR max vel: 1.07 cm2
AV Area VTI: 1.22 cm2
AV Area mean vel: 0.95 cm2
AV Mean grad: 11 mmHg
AV Peak grad: 19.4 mmHg
AV Vena cont: 0.3 cm
Ao pk vel: 2.2 m/s
Area-P 1/2: 6.37 cm2
Calc EF: 43.9 %
MV VTI: 1.96 cm2
P 1/2 time: 414 ms
S' Lateral: 3.3 cm
Single Plane A2C EF: 40.9 %
Single Plane A4C EF: 40.6 %

## 2023-09-19 NOTE — Progress Notes (Signed)
 CARDIOLOGY CONSULT NOTE       Patient ID: TYRIKA NEWMAN MRN: [id number] DOB/AGE: 1940-09-29 83 y.o.  Admit date: (Not on file) Referring Physician: Clementine Cutting Primary Physician: Fredick Jarred, PA-C Primary Cardiologist: New Reason for Consultation: AVD    HPI:  83 y.o. I have not seen since 2022 Has been seen by [facility] cardiology and PA;s Peck/Quinn. She has histroy of HTN, HLD, DM-2, Asthma and AS/AR. Diagnosed with right LE DVT August 2024 with ? Bakers cyst as well. Has chronic LE edema.   Enjoys doing puzzles in her free time.  She has a total of 22 grandchildren-including grandchildren, great-grandchildren, and great great grandchildren. Lives with granddaugter who works as a Buyer, retail at [employer].   F/U duplex 07/30/23 with no DVT but some superficial left small saphenous vein thrombus and right knee bakers cyst  Echo done 07/30/23 with EF 40-45% Mild MR mild/mod AR/AS with mean gradient 11 mmHg AVA 1.22 cm2 peak gradient 19/4 mmHg and DVI 0.39  She has no documented CAD. Myovue done 06/2020 normal perfusion with EF 55-65%   Has two daughters near her no cardiac symptoms  ROS All other systems reviewed and negative except as noted above  Past Medical History:  Diagnosis Date   Asthma    Diabetes (HCC)    HTN (hypertension)    Hyperlipemia     Family History  Problem Relation Age of Onset   Colon cancer Mother        AGE > 60   Colon polyps Neg Hx     Social History   Socioeconomic History   Marital status: Divorced    Spouse name: Not on file   Number of children: Not on file   Years of education: Not on file   Highest education level: Not on file  Occupational History   Not on file  Tobacco Use   Smoking status: Former   Smokeless tobacco: Never  Vaping Use   Vaping status: Never Used  Substance and Sexual Activity   Alcohol  use: Not Currently   Drug use: Not Currently   Sexual activity: Not on file  Other Topics Concern   Not on  file  Social History Narrative   LIVES WITH DAUGHTER. DIVORCED. SPENDS FREE TIME: WORKING AROUND THE HOUSE, GARDENING.   Social Drivers of Corporate investment banker Strain: Not on file  Food Insecurity: Not on file  Transportation Needs: Not on file  Physical Activity: Not on file  Stress: Not on file  Social Connections: Not on file  Intimate Partner Violence: Not on file    Past Surgical History:  Procedure Laterality Date   ABDOMINAL HYSTERECTOMY     PROLAPSED    CATARACT EXTRACTION W/PHACO Right 07/05/2022   Procedure: CATARACT EXTRACTION PHACO AND INTRAOCULAR LENS PLACEMENT (IOC);  Surgeon: Tarri Farm, MD;  Location: [facility] ORS;  Service: Ophthalmology;  Laterality: Right;  CDE 89.88   COLONOSCOPY N/A 07/05/2019   Procedure: COLONOSCOPY;  Surgeon: Alyce Jubilee, MD;  Location: [facility] ENDO SUITE;  Service: Endoscopy;  Laterality: N/A;  1:00pm   COLONOSCOPY W/ POLYPECTOMY  11/2007   SIMPLE ADENOMAS   OVARIAN CYST REMOVAL     POLYPECTOMY  07/05/2019   Procedure: POLYPECTOMY;  Surgeon: Alyce Jubilee, MD;  Location: [facility];  Service: Endoscopy;;      Current Outpatient Medications:    albuterol  (VENTOLIN  HFA) 108 (90 Base) MCG/ACT inhaler, Inhale 1-2 puffs into the lungs every 6 (six) hours  as needed for wheezing or shortness of breath. , Disp: , Rfl:    Cyanocobalamin (VITAMIN B-12 PO), Take 1 tablet by mouth 3 (three) times a week., Disp: , Rfl:    ELIQUIS 5 MG TABS tablet, Take 5 mg by mouth 2 (two) times daily., Disp: , Rfl:    furosemide (LASIX) 20 MG tablet, Take 20 mg by mouth daily., Disp: , Rfl:    losartan (COZAAR) 100 MG tablet, Take 100 mg by mouth daily., Disp: , Rfl:    metFORMIN (GLUCOPHAGE) 500 MG tablet, Take 500 mg by mouth daily., Disp: , Rfl:    metoprolol  succinate (TOPROL -XL) 50 MG 24 hr tablet, Take 50 mg by mouth daily., Disp: , Rfl:    nitroGLYCERIN  (NITROSTAT ) 0.4 MG SL tablet, Place 1 tablet (0.4 mg total) under the tongue every 5 (five) minutes as  needed for chest pain., Disp: 25 tablet, Rfl: 3   omeprazole (PRILOSEC) 40 MG capsule, Take 40 mg by mouth daily., Disp: , Rfl:    simvastatin  (ZOCOR ) 20 MG tablet, Take 1 tablet (20 mg total) by mouth daily., Disp: 30 tablet, Rfl: 6   SYMBICORT 160-4.5 MCG/ACT inhaler, Inhale 2 puffs into the lungs 2 (two) times daily., Disp: , Rfl:     Physical Exam: Blood pressure (!) 144/82, pulse 78, height 4\' 11"  (1.499 m), weight 109 lb 9.6 oz (49.7 kg), SpO2 98%.    Affect appropriate Elderly female Frail and kyphotic HEENT: normal Neck supple with no adenopathy JVP normal no bruits no thyromegaly Lungs clear with no wheezing and good diaphragmatic motion Heart:  S1/S2 AS/AR murmur, no rub, gallop or click PMI normal Abdomen: benighn, BS positve, no tenderness, no AAA no bruit.  No HSM or HJR Distal pulses intact with no bruits Bilateral LE edema trace Neuro non-focal Skin warm and dry No muscular weakness   Labs:   Lab Results  Component Value Date   WBC 10.0 01/08/2023   HGB 11.7 01/08/2023   HCT 34.1 01/08/2023   MCV 92 01/08/2023   PLT 439 01/08/2023   No results for input(s): "NA", "K", "CL", "CO2", "BUN", "CREATININE", "CALCIUM", "PROT", "BILITOT", "ALKPHOS", "ALT", "AST", "GLUCOSE" in the last 168 hours.  Invalid input(s): "LABALBU" No results found for: "CKTOTAL", "CKMB", "CKMBINDEX", "TROPONINI"  Lab Results  Component Value Date   CHOL 185 01/08/2023   CHOL 162 10/22/2007   Lab Results  Component Value Date   HDL 53 01/08/2023   HDL 32 10/22/2007   Lab Results  Component Value Date   LDLCALC 113 (H) 01/08/2023   LDLCALC 97 10/22/2007   Lab Results  Component Value Date   TRIG 107 01/08/2023   TRIG 166 10/22/2007   Lab Results  Component Value Date   CHOLHDL 3.5 01/08/2023   CHOLHDL 5.1 10/22/2007   No results found for: "LDLDIRECT"    Radiology: No results found.  EKG: SR rate 65 PAC nonspecific ST changes 12/17/22   ASSESSMENT AND PLAN:    AS/AR;  mild/moderate not needing consideration for TAVR at this time F/U echo 12/2023 HTN:  continue diuretic, beta blocker and lasix Asthma no active wheezing has symbicort HLD:  continue statin labs with primary last LDL 113 01/08/23 given age will not escalate dose  DCM:  EF normal by TTE and myovue 2022 Reduced by TTE 08/01/23   DVT:  Diagnosed at [facility] 12/16/22 no deep thrombosis on f/u  scan 07/30/23  Primary and diagnosing provider has d/c eliquis at this time   Echo  for EF/AS/AR March 2026   Signed: Janelle Mediate 09/25/2023, 1:22 PM

## 2023-09-25 ENCOUNTER — Ambulatory Visit: Payer: Medicare HMO | Attending: Cardiovascular Disease | Admitting: Cardiovascular Disease

## 2023-09-25 ENCOUNTER — Encounter: Payer: Self-pay | Admitting: Cardiovascular Disease

## 2023-09-25 VITALS — BP 144/82 | HR 78 | Ht 59.0 in | Wt 109.6 lb

## 2023-09-25 DIAGNOSIS — I10 Essential (primary) hypertension: Secondary | ICD-10-CM | POA: Diagnosis not present

## 2023-09-25 DIAGNOSIS — I350 Nonrheumatic aortic (valve) stenosis: Secondary | ICD-10-CM

## 2023-09-25 DIAGNOSIS — I82411 Acute embolism and thrombosis of right femoral vein: Secondary | ICD-10-CM

## 2023-09-25 NOTE — Patient Instructions (Signed)
 Medication Instructions:  Your physician recommends that you continue on your current medications as directed. Please refer to the Current Medication list given to you today.   Labwork: None today  Testing/Procedures: Your physician has requested that you have an echocardiogram in [facility] 2026. Echocardiography is a painless test that uses sound waves to create images of your heart. It provides your doctor with information about the size and shape of your heart and how well your heart's chambers and valves are working. This procedure takes approximately one hour. There are no restrictions for this procedure. Please do NOT wear cologne, perfume, aftershave, or lotions (deodorant is allowed). Please arrive 15 minutes prior to your appointment time.  Please note: We ask at that you not bring children with you during ultrasound (echo/ vascular) testing. Due to room size and safety concerns, children are not allowed in the ultrasound rooms during exams. Our front office staff cannot provide observation of children in our lobby area while testing is being conducted. An adult accompanying a patient to their appointment will only be allowed in the ultrasound room at the discretion of the ultrasound technician under special circumstances. We apologize for any inconvenience.   Follow-Up: March 2026 with Dr.Nishan  Any Other Special Instructions Will Be Listed Below (If Applicable).  If you need a refill on your cardiac medications before your next appointment, please call your pharmacy.

## 2023-11-01 ENCOUNTER — Ambulatory Visit: Payer: Self-pay | Admitting: Nurse Practitioner

## 2024-03-14 NOTE — Progress Notes (Deleted)
 CARDIOLOGY CONSULT NOTE       Patient ID: [name] MRN: [id number] DOB/AGE: 08-Oct-1940 83 y.o.  Admit date: (Not on file) Referring Physician: Miriam Primary Physician: Dow Longs, PA-C Primary Cardiologist: Delford Reason for Consultation: AVD    HPI:  83 y.o. I saw in May 2025 after 3 year absence Has been seen by [facility] cardiology and PA;s Peck/Quinn. She has histroy of HTN, HLD, DM-2, Asthma and AS/AR. Diagnosed with right LE DVT August 2024 with ? Bakers cyst as well. Has chronic LE edema.   Enjoys doing puzzles in her free time.  She has a total of 22 grandchildren-including grandchildren, great-grandchildren, and great great grandchildren. Lives with granddaugter who works as a Buyer, Retail at [employer].   F/U duplex 07/30/23 with no DVT but some superficial left small saphenous vein thrombus and right knee bakers cyst  Echo done 07/30/23 with EF 40-45% Mild MR mild/mod AR/AS with mean gradient 11 mmHg AVA 1.22 cm2 peak gradient 19/4 mmHg and DVI 0.39  She has no documented CAD. Myovue done 06/2020 normal perfusion with EF 55-65%   ***  ROS All other systems reviewed and negative except as noted above  Past Medical History:  Diagnosis Date   Asthma    Diabetes (HCC)    HTN (hypertension)    Hyperlipemia     Family History  Problem Relation Age of Onset   Colon cancer Mother        AGE > 60   Colon polyps Neg Hx     Social History   Socioeconomic History   Marital status: Divorced    Spouse name: Not on file   Number of children: Not on file   Years of education: Not on file   Highest education level: Not on file  Occupational History   Not on file  Tobacco Use   Smoking status: Former   Smokeless tobacco: Never  Vaping Use   Vaping status: Never Used  Substance and Sexual Activity   Alcohol  use: Not Currently   Drug use: Not Currently   Sexual activity: Not on file  Other Topics Concern   Not on file  Social History  Narrative   LIVES WITH DAUGHTER. DIVORCED. SPENDS FREE TIME: WORKING AROUND THE HOUSE, GARDENING.   Social Drivers of Corporate Investment Banker Strain: Not on file  Food Insecurity: Not on file  Transportation Needs: Not on file  Physical Activity: Not on file  Stress: Not on file  Social Connections: Not on file  Intimate Partner Violence: Not on file    Past Surgical History:  Procedure Laterality Date   ABDOMINAL HYSTERECTOMY     PROLAPSED    CATARACT EXTRACTION W/PHACO Right 07/05/2022   Procedure: CATARACT EXTRACTION PHACO AND INTRAOCULAR LENS PLACEMENT (IOC);  Surgeon: Harrie Agent, MD;  Location: [facility];  Service: Ophthalmology;  Laterality: Right;  CDE 89.88   COLONOSCOPY N/A 07/05/2019   Procedure: COLONOSCOPY;  Surgeon: Harvey Margo CROME, MD;  Location: [facility];  Service: Endoscopy;  Laterality: N/A;  1:00pm   COLONOSCOPY W/ POLYPECTOMY  11/2007   SIMPLE ADENOMAS   OVARIAN CYST REMOVAL     POLYPECTOMY  07/05/2019   Procedure: POLYPECTOMY;  Surgeon: Harvey Margo CROME, MD;  Location: [facility];  Service: Endoscopy;;      Current Outpatient Medications:    albuterol  (VENTOLIN  HFA) 108 (90 Base) MCG/ACT inhaler, Inhale 1-2 puffs into the lungs every 6 (six) hours as needed for wheezing  or shortness of breath. , Disp: , Rfl:    Cyanocobalamin (VITAMIN B-12 PO), Take 1 tablet by mouth 3 (three) times a week., Disp: , Rfl:    ELIQUIS 5 MG TABS tablet, Take 5 mg by mouth 2 (two) times daily., Disp: , Rfl:    furosemide (LASIX) 20 MG tablet, Take 20 mg by mouth daily., Disp: , Rfl:    losartan (COZAAR) 100 MG tablet, Take 100 mg by mouth daily., Disp: , Rfl:    metFORMIN (GLUCOPHAGE) 500 MG tablet, Take 500 mg by mouth daily., Disp: , Rfl:    metoprolol  succinate (TOPROL -XL) 50 MG 24 hr tablet, Take 50 mg by mouth daily., Disp: , Rfl:    nitroGLYCERIN  (NITROSTAT ) 0.4 MG SL tablet, Place 1 tablet (0.4 mg total) under the tongue every 5 (five) minutes as needed for chest  pain., Disp: 25 tablet, Rfl: 3   omeprazole (PRILOSEC) 40 MG capsule, Take 40 mg by mouth daily., Disp: , Rfl:    simvastatin  (ZOCOR ) 20 MG tablet, Take 1 tablet (20 mg total) by mouth daily., Disp: 30 tablet, Rfl: 6   SYMBICORT 160-4.5 MCG/ACT inhaler, Inhale 2 puffs into the lungs 2 (two) times daily., Disp: , Rfl:     Physical Exam: There were no vitals taken for this visit.    Affect appropriate Elderly female Frail and kyphotic HEENT: normal Neck supple with no adenopathy JVP normal no bruits no thyromegaly Lungs clear with no wheezing and good diaphragmatic motion Heart:  S1/S2 AS/AR murmur, no rub, gallop or click PMI normal Abdomen: benighn, BS positve, no tenderness, no AAA no bruit.  No HSM or HJR Distal pulses intact with no bruits Bilateral LE edema trace Neuro non-focal Skin warm and dry No muscular weakness   Labs:   Lab Results  Component Value Date   WBC 10.0 01/08/2023   HGB 11.7 01/08/2023   HCT 34.1 01/08/2023   MCV 92 01/08/2023   PLT 439 01/08/2023   No results for input(s): NA, K, CL, CO2, BUN, CREATININE, CALCIUM, PROT, BILITOT, ALKPHOS, ALT, AST, GLUCOSE in the last 168 hours.  Invalid input(s): LABALBU No results found for: CKTOTAL, CKMB, CKMBINDEX, TROPONINI  Lab Results  Component Value Date   CHOL 185 01/08/2023   CHOL 162 10/22/2007   Lab Results  Component Value Date   HDL 53 01/08/2023   HDL 32 10/22/2007   Lab Results  Component Value Date   LDLCALC 113 (H) 01/08/2023   LDLCALC 97 10/22/2007   Lab Results  Component Value Date   TRIG 107 01/08/2023   TRIG 166 10/22/2007   Lab Results  Component Value Date   CHOLHDL 3.5 01/08/2023   CHOLHDL 5.1 10/22/2007   No results found for: LDLDIRECT    Radiology: No results found.  EKG: SR rate 65 PAC nonspecific ST changes 12/17/22   ASSESSMENT AND PLAN:   AS/AR;  mild/moderate not needing consideration for TAVR at this time F/U echo   March 2026  HTN:  continue diuretic, beta blocker and lasix Asthma no active wheezing has symbicort HLD:  continue statin labs with primary last LDL 113 01/08/23 given age will not escalate dose  DCM:  EF normal by TTE and myovue 2022 Reduced by TTE 08/01/23   DVT:  Diagnosed at [facility] 12/16/22 no deep thrombosis on f/u  scan 07/30/23  Primary and diagnosing provider has d/c eliquis at this time   Echo for EF/AS/AR March 2026  F/U in a year   Signed: Maude  Leydy Worthey 03/14/2024, 5:34 PM

## 2024-03-17 ENCOUNTER — Ambulatory Visit: Attending: Cardiovascular Disease | Admitting: Cardiovascular Disease

## 2024-03-19 NOTE — Progress Notes (Signed)
 [name]                                          MRN: [id number]   03/19/2024   The [facility] Quality Team Specialist reviewed this patient medical record for the purposes of chart review for care gap closure. The following were reviewed: chart review for care gap closure-controlling blood pressure.    [facility] Quality Team

## 2024-06-11 NOTE — Progress Notes (Signed)
 [name]                                          MRN: [id number]   06/11/2024   The [facility] Quality Team Specialist reviewed this patient medical record for the purposes of chart review for care gap closure. The following were reviewed: chart review for care gap closure-kidney health evaluation for diabetes:eGFR  and uACR.    [facility] Quality Team

## 2024-07-07 ENCOUNTER — Other Ambulatory Visit (HOSPITAL_COMMUNITY)

## 2024-07-09 ENCOUNTER — Ambulatory Visit: Admitting: Cardiovascular Disease
